# Patient Record
Sex: Female | Born: 1935 | Race: White | Hispanic: No | Marital: Married | Smoking: Never smoker
Health system: Southern US, Community
[De-identification: ages and names within clinical notes are randomized; demographics above are authoritative.]

## PROBLEM LIST (undated history)

## (undated) DIAGNOSIS — R32 Unspecified urinary incontinence: Secondary | ICD-10-CM

## (undated) DIAGNOSIS — I2510 Atherosclerotic heart disease of native coronary artery without angina pectoris: Secondary | ICD-10-CM

## (undated) DIAGNOSIS — I10 Essential (primary) hypertension: Secondary | ICD-10-CM

## (undated) DIAGNOSIS — I219 Acute myocardial infarction, unspecified: Secondary | ICD-10-CM

## (undated) DIAGNOSIS — Z4420 Encounter for fitting and adjustment of artificial eye, unspecified: Secondary | ICD-10-CM

## (undated) DIAGNOSIS — I255 Ischemic cardiomyopathy: Secondary | ICD-10-CM

## (undated) DIAGNOSIS — H409 Unspecified glaucoma: Secondary | ICD-10-CM

## (undated) DIAGNOSIS — I513 Intracardiac thrombosis, not elsewhere classified: Secondary | ICD-10-CM

## (undated) DIAGNOSIS — N390 Urinary tract infection, site not specified: Secondary | ICD-10-CM

## (undated) DIAGNOSIS — C50919 Malignant neoplasm of unspecified site of unspecified female breast: Secondary | ICD-10-CM

## (undated) HISTORY — PX: MASTECTOMY: SHX3

## (undated) HISTORY — PX: CORONARY ARTERY BYPASS GRAFT: SHX141

## (undated) HISTORY — PX: JOINT REPLACEMENT: SHX530

---

## 1998-11-11 ENCOUNTER — Ambulatory Visit: Admission: RE | Admit: 1998-11-11 | Discharge: 1998-11-11 | Payer: Self-pay | Admitting: Orthopedic Surgery

## 1998-11-11 ENCOUNTER — Encounter: Payer: Self-pay | Admitting: Orthopedic Surgery

## 1998-11-27 ENCOUNTER — Encounter: Payer: Self-pay | Admitting: Orthopedic Surgery

## 1998-11-27 ENCOUNTER — Inpatient Hospital Stay (HOSPITAL_COMMUNITY): Admission: RE | Admit: 1998-11-27 | Discharge: 1998-12-03 | Payer: Self-pay | Admitting: Orthopedic Surgery

## 2006-04-22 ENCOUNTER — Ambulatory Visit: Payer: Self-pay

## 2006-05-12 ENCOUNTER — Inpatient Hospital Stay (HOSPITAL_COMMUNITY): Admission: RE | Admit: 2006-05-12 | Discharge: 2006-05-15 | Payer: Self-pay | Admitting: Orthopedic Surgery

## 2013-05-25 ENCOUNTER — Emergency Department (HOSPITAL_COMMUNITY)
Admission: EM | Admit: 2013-05-25 | Discharge: 2013-05-25 | Disposition: A | Discharge status: Home / Self Care | Payer: Medicare Other | Attending: Emergency Medicine | Admitting: Emergency Medicine

## 2013-05-25 ENCOUNTER — Encounter (HOSPITAL_COMMUNITY): Payer: Self-pay | Admitting: Emergency Medicine

## 2013-05-25 DIAGNOSIS — I2510 Atherosclerotic heart disease of native coronary artery without angina pectoris: Secondary | ICD-10-CM | POA: Insufficient documentation

## 2013-05-25 DIAGNOSIS — Z79899 Other long term (current) drug therapy: Secondary | ICD-10-CM | POA: Insufficient documentation

## 2013-05-25 DIAGNOSIS — Z7982 Long term (current) use of aspirin: Secondary | ICD-10-CM | POA: Insufficient documentation

## 2013-05-25 DIAGNOSIS — R040 Epistaxis: Secondary | ICD-10-CM | POA: Insufficient documentation

## 2013-05-25 DIAGNOSIS — I10 Essential (primary) hypertension: Secondary | ICD-10-CM | POA: Insufficient documentation

## 2013-05-25 DIAGNOSIS — K625 Hemorrhage of anus and rectum: Secondary | ICD-10-CM

## 2013-05-25 DIAGNOSIS — Z951 Presence of aortocoronary bypass graft: Secondary | ICD-10-CM | POA: Insufficient documentation

## 2013-05-25 HISTORY — DX: Essential (primary) hypertension: I10

## 2013-05-25 HISTORY — DX: Atherosclerotic heart disease of native coronary artery without angina pectoris: I25.10

## 2013-05-25 LAB — CBC WITH DIFFERENTIAL/PLATELET
Basophils Absolute: 0.1 10*3/uL (ref 0.0–0.1)
Basophils Absolute: 0 10*3/uL (ref 0.0–0.1)
Basophils Relative: 1 % (ref 0–1)
Eosinophils Absolute: 0.1 10*3/uL (ref 0.0–0.7)
Eosinophils Absolute: 0.1 10*3/uL (ref 0.0–0.7)
Eosinophils Relative: 1 % (ref 0–5)
Eosinophils Relative: 1 % (ref 0–5)
HCT: 35.8 % — ABNORMAL LOW (ref 36.0–46.0)
Hemoglobin: 11.5 g/dL — ABNORMAL LOW (ref 12.0–15.0)
Hemoglobin: 10.5 g/dL — ABNORMAL LOW (ref 12.0–15.0)
Lymphocytes Relative: 21 % (ref 12–46)
Lymphs Abs: 1.7 10*3/uL (ref 0.7–4.0)
Lymphs Abs: 1.9 10*3/uL (ref 0.7–4.0)
MCH: 27.2 pg (ref 26.0–34.0)
MCH: 26.6 pg (ref 26.0–34.0)
MCHC: 32.1 g/dL (ref 30.0–36.0)
MCV: 84.6 fL (ref 78.0–100.0)
MCV: 83.2 fL (ref 78.0–100.0)
Monocytes Absolute: 0.6 10*3/uL (ref 0.1–1.0)
Monocytes Absolute: 0.6 10*3/uL (ref 0.1–1.0)
Monocytes Relative: 7 % (ref 3–12)
Monocytes Relative: 7 % (ref 3–12)
Neutro Abs: 5.9 10*3/uL (ref 1.7–7.7)
Neutro Abs: 5.1 10*3/uL (ref 1.7–7.7)
Neutrophils Relative %: 71 % (ref 43–77)
Platelets: 301 10*3/uL (ref 150–400)
RBC: 4.23 MIL/uL (ref 3.87–5.11)
RBC: 3.94 MIL/uL (ref 3.87–5.11)
RDW: 15 % (ref 11.5–15.5)
WBC: 8.4 10*3/uL (ref 4.0–10.5)

## 2013-05-25 LAB — BASIC METABOLIC PANEL
CO2: 25 mEq/L (ref 19–32)
Calcium: 8.9 mg/dL (ref 8.4–10.5)
Chloride: 102 mEq/L (ref 96–112)
Glucose, Bld: 105 mg/dL — ABNORMAL HIGH (ref 70–99)
Potassium: 4.3 mEq/L (ref 3.5–5.1)
Sodium: 134 mEq/L — ABNORMAL LOW (ref 135–145)

## 2013-05-25 MED ORDER — SODIUM CHLORIDE 0.9 % IV BOLUS (SEPSIS)
500.0000 mL | Freq: Once | INTRAVENOUS | Status: AC
  Administered 2013-05-25: 500 mL via INTRAVENOUS

## 2013-05-25 NOTE — ED Notes (Signed)
Bed: WA09 Expected date:  Expected time:  Means of arrival:  Comments: EMS 79yo nosebleed / rectal bleed

## 2013-05-25 NOTE — ED Provider Notes (Signed)
CSN: [id number]     Arrival date & time 05/25/13  2013 History   First MD Initiated Contact with Patient 05/25/13 2111     Chief Complaint  Patient presents with  . Epistaxis  . Rectal Bleeding   (Consider location/radiation/quality/duration/timing/severity/associated sxs/prior Treatment) Patient is a 79 y.o. female presenting with hematochezia. The history is provided by the patient (the pt states she had some rectal bleeding today and some nasal bleeding).  Rectal Bleeding Quality:  Maroon Amount:  Moderate Timing:  Rare Progression:  Resolved Chronicity:  New Context: not anal fissures   Associated symptoms: no abdominal pain     Past Medical History  Diagnosis Date  . Coronary artery disease   . Hypertension    Past Surgical History  Procedure Laterality Date  . Mastectomy    . Coronary artery bypass graft     No family history on file. History  Substance Use Topics  . Smoking status: Never Smoker   . Smokeless tobacco: Not on file  . Alcohol Use: No   OB History   Grav Para Term Preterm Abortions TAB SAB Ect Mult Living                 Review of Systems  Constitutional: Negative for appetite change and fatigue.  HENT: Negative for congestion, ear discharge and sinus pressure.   Eyes: Negative for discharge.  Respiratory: Negative for cough.   Cardiovascular: Negative for chest pain.  Gastrointestinal: Positive for blood in stool and hematochezia. Negative for abdominal pain and diarrhea.  Genitourinary: Negative for frequency and hematuria.  Musculoskeletal: Negative for back pain.  Skin: Negative for rash.  Neurological: Negative for seizures and headaches.  Psychiatric/Behavioral: Negative for hallucinations.    Allergies  Codeine  Home Medications   Current Outpatient Rx  Name  Route  Sig  Dispense  Refill  . aspirin 325 MG EC tablet   Oral   Take 325 mg by mouth at bedtime.         . benazepril (LOTENSIN) 5 MG tablet   Oral   Take 5 mg  by mouth at bedtime.         . cholecalciferol (VITAMIN D) 1000 UNITS tablet   Oral   Take 2,000 Units by mouth at bedtime.         . Cranberry 450 MG TABS   Oral   Take 900 mg by mouth at bedtime.         Marland Kitchen latanoprost (XALATAN) 0.005 % ophthalmic solution   Left Eye   Place 1 drop into the left eye at bedtime.         . Magnesium 100 MG TABS   Oral   Take 600 mg by mouth at bedtime.         . metoprolol tartrate (LOPRESSOR) 25 MG tablet   Oral   Take 25 mg by mouth at bedtime.          . Multiple Vitamin (MULTIVITAMIN WITH MINERALS) TABS tablet   Oral   Take 1 tablet by mouth at bedtime.         . vitamin C (ASCORBIC ACID) 500 MG tablet   Oral   Take 1,000 mg by mouth at bedtime.          BP 143/75  Pulse 95  Temp(Src) 98.4 F (36.9 C) (Oral)  Resp 18  SpO2 98% Physical Exam  Constitutional: She is oriented to person, place, and time. She appears well-developed.  HENT:  Head: Normocephalic.  Eyes: Conjunctivae and EOM are normal. No scleral icterus.  Neck: Neck supple. No thyromegaly present.  Cardiovascular: Normal rate and regular rhythm.  Exam reveals no gallop and no friction rub.   No murmur heard. Pulmonary/Chest: No stridor. She has no wheezes. She has no rales. She exhibits no tenderness.  Abdominal: She exhibits no distension. There is no tenderness. There is no rebound.  Genitourinary:  Brown stool.  Hem neg  Musculoskeletal: Normal range of motion. She exhibits no edema.  Lymphadenopathy:    She has no cervical adenopathy.  Neurological: She is oriented to person, place, and time. She exhibits normal muscle tone. Coordination normal.  Skin: No rash noted. No erythema.  Psychiatric: She has a normal mood and affect. Her behavior is normal.    ED Course  Procedures (including critical care time) Labs Review Labs Reviewed  CBC WITH DIFFERENTIAL - Abnormal; Notable for the following:    Hemoglobin 11.5 (*)    HCT 35.8 (*)    All  other components within normal limits  BASIC METABOLIC PANEL - Abnormal; Notable for the following:    Sodium 134 (*)    Glucose, Bld 105 (*)    GFR calc non Af Amer 76 (*)    GFR calc Af Amer 88 (*)    All other components within normal limits  CBC WITH DIFFERENTIAL - Abnormal; Notable for the following:    Hemoglobin 10.5 (*)    HCT 32.8 (*)    All other components within normal limits  BASIC METABOLIC PANEL   Imaging Review No results found.  EKG Interpretation   None       MDM   1. Rectal bleeding        Benny Lennert, MD 05/25/13 [phone]

## 2013-05-25 NOTE — ED Notes (Addendum)
Lab notified that blood samples for CMP were incompatable with life and need to redraw specimens

## 2013-05-25 NOTE — ED Notes (Signed)
Arrives from home vie [facility], reports doing laundry when she experienced bright red blood per rectum and epistaxis, both of which lasted approx 3 min and resolved, denies pain, SOB, and pain, N/V/D, is asymptomatic and VSS, ambulatory on arrival and in NAD

## 2013-07-06 ENCOUNTER — Inpatient Hospital Stay (HOSPITAL_COMMUNITY)
Admission: EM | Admit: 2013-07-06 | Discharge: 2013-07-09 | DRG: 247 | Disposition: A | Discharge status: Home / Self Care | Payer: Medicare Other | Attending: Internal Medicine | Admitting: Internal Medicine

## 2013-07-06 ENCOUNTER — Encounter (HOSPITAL_COMMUNITY): Payer: Self-pay | Admitting: Emergency Medicine

## 2013-07-06 ENCOUNTER — Emergency Department (HOSPITAL_COMMUNITY): Payer: Medicare Other

## 2013-07-06 DIAGNOSIS — Z96649 Presence of unspecified artificial hip joint: Secondary | ICD-10-CM

## 2013-07-06 DIAGNOSIS — E785 Hyperlipidemia, unspecified: Secondary | ICD-10-CM | POA: Diagnosis present

## 2013-07-06 DIAGNOSIS — I2510 Atherosclerotic heart disease of native coronary artery without angina pectoris: Secondary | ICD-10-CM | POA: Diagnosis present

## 2013-07-06 DIAGNOSIS — Z833 Family history of diabetes mellitus: Secondary | ICD-10-CM

## 2013-07-06 DIAGNOSIS — I5189 Other ill-defined heart diseases: Secondary | ICD-10-CM | POA: Diagnosis present

## 2013-07-06 DIAGNOSIS — Z970 Presence of artificial eye: Secondary | ICD-10-CM

## 2013-07-06 DIAGNOSIS — Z889 Allergy status to unspecified drugs, medicaments and biological substances status: Secondary | ICD-10-CM

## 2013-07-06 DIAGNOSIS — Z79899 Other long term (current) drug therapy: Secondary | ICD-10-CM

## 2013-07-06 DIAGNOSIS — I25810 Atherosclerosis of coronary artery bypass graft(s) without angina pectoris: Secondary | ICD-10-CM | POA: Diagnosis present

## 2013-07-06 DIAGNOSIS — I2582 Chronic total occlusion of coronary artery: Secondary | ICD-10-CM | POA: Diagnosis present

## 2013-07-06 DIAGNOSIS — H409 Unspecified glaucoma: Secondary | ICD-10-CM | POA: Diagnosis present

## 2013-07-06 DIAGNOSIS — I219 Acute myocardial infarction, unspecified: Secondary | ICD-10-CM | POA: Diagnosis present

## 2013-07-06 DIAGNOSIS — Z823 Family history of stroke: Secondary | ICD-10-CM

## 2013-07-06 DIAGNOSIS — I214 Non-ST elevation (NSTEMI) myocardial infarction: Principal | ICD-10-CM | POA: Diagnosis present

## 2013-07-06 DIAGNOSIS — Z853 Personal history of malignant neoplasm of breast: Secondary | ICD-10-CM

## 2013-07-06 DIAGNOSIS — Z7982 Long term (current) use of aspirin: Secondary | ICD-10-CM

## 2013-07-06 DIAGNOSIS — R079 Chest pain, unspecified: Secondary | ICD-10-CM

## 2013-07-06 DIAGNOSIS — I10 Essential (primary) hypertension: Secondary | ICD-10-CM | POA: Diagnosis present

## 2013-07-06 DIAGNOSIS — Z8249 Family history of ischemic heart disease and other diseases of the circulatory system: Secondary | ICD-10-CM

## 2013-07-06 DIAGNOSIS — Z951 Presence of aortocoronary bypass graft: Secondary | ICD-10-CM

## 2013-07-06 DIAGNOSIS — I2589 Other forms of chronic ischemic heart disease: Secondary | ICD-10-CM | POA: Diagnosis present

## 2013-07-06 DIAGNOSIS — I252 Old myocardial infarction: Secondary | ICD-10-CM

## 2013-07-06 DIAGNOSIS — I200 Unstable angina: Secondary | ICD-10-CM | POA: Diagnosis present

## 2013-07-06 HISTORY — DX: Acute myocardial infarction, unspecified: I21.9

## 2013-07-06 HISTORY — DX: Ischemic cardiomyopathy: I25.5

## 2013-07-06 HISTORY — DX: Unspecified urinary incontinence: R32

## 2013-07-06 HISTORY — DX: Unspecified glaucoma: H40.9

## 2013-07-06 HISTORY — DX: Encounter for fitting and adjustment of artificial eye, unspecified: Z44.20

## 2013-07-06 HISTORY — DX: Intracardiac thrombosis, not elsewhere classified: I51.3

## 2013-07-06 HISTORY — DX: Malignant neoplasm of unspecified site of unspecified female breast: C50.919

## 2013-07-06 HISTORY — DX: Urinary tract infection, site not specified: N39.0

## 2013-07-06 LAB — BASIC METABOLIC PANEL
CO2: 28 mEq/L (ref 19–32)
Calcium: 9 mg/dL (ref 8.4–10.5)
GFR calc Af Amer: 87 mL/min — ABNORMAL LOW (ref >90)
GFR calc non Af Amer: 75 mL/min — ABNORMAL LOW (ref >90)
Glucose, Bld: 107 mg/dL — ABNORMAL HIGH (ref 70–99)
Sodium: 134 mEq/L — ABNORMAL LOW (ref 135–145)

## 2013-07-06 LAB — CBC WITH DIFFERENTIAL/PLATELET
Basophils Absolute: 0 10*3/uL (ref 0.0–0.1)
Basophils Relative: 0 % (ref 0–1)
Eosinophils Absolute: 0.1 10*3/uL (ref 0.0–0.7)
Eosinophils Relative: 1 % (ref 0–5)
Lymphs Abs: 1.6 10*3/uL (ref 0.7–4.0)
MCH: 26.2 pg (ref 26.0–34.0)
MCV: 82.4 fL (ref 78.0–100.0)
Neutro Abs: 8.7 10*3/uL — ABNORMAL HIGH (ref 1.7–7.7)
Neutrophils Relative %: 77 % (ref 43–77)
Platelets: 361 10*3/uL (ref 150–400)
RDW: 15.7 % — ABNORMAL HIGH (ref 11.5–15.5)

## 2013-07-06 MED ORDER — HEPARIN BOLUS VIA INFUSION
2500.0000 [IU] | Freq: Once | INTRAVENOUS | Status: AC
  Administered 2013-07-06: 2500 [IU] via INTRAVENOUS
  Filled 2013-07-06 (×2): qty 2500

## 2013-07-06 MED ORDER — DIPHENHYDRAMINE HCL 25 MG PO CAPS
25.0000 mg | ORAL_CAPSULE | Freq: Once | ORAL | Status: AC
  Administered 2013-07-06: 25 mg via ORAL
  Filled 2013-07-06: qty 1

## 2013-07-06 MED ORDER — SODIUM CHLORIDE 0.9 % IV SOLN: 250.0000 mL | INTRAVENOUS | Status: DC | PRN

## 2013-07-06 MED ORDER — ACETAMINOPHEN 325 MG PO TABS
650.0000 mg | ORAL_TABLET | ORAL | Status: DC | PRN
  Administered 2013-07-07: 650 mg via ORAL
  Filled 2013-07-06: qty 2

## 2013-07-06 MED ORDER — ASPIRIN 300 MG RE SUPP
300.0000 mg | RECTAL | Status: DC
  Filled 2013-07-06: qty 1

## 2013-07-06 MED ORDER — NITROGLYCERIN IN D5W 200-5 MCG/ML-% IV SOLN: 2.0000 ug/min | INTRAVENOUS | Status: DC

## 2013-07-06 MED ORDER — SODIUM CHLORIDE 0.9 % IJ SOLN: 3.0000 mL | INTRAMUSCULAR | Status: DC | PRN

## 2013-07-06 MED ORDER — NITROGLYCERIN 0.4 MG SL SUBL
0.4000 mg | SUBLINGUAL_TABLET | SUBLINGUAL | Status: DC | PRN
  Administered 2013-07-07 (×2): 0.4 mg via SUBLINGUAL
  Filled 2013-07-06: qty 25

## 2013-07-06 MED ORDER — METOPROLOL TARTRATE 12.5 MG HALF TABLET: 6.2500 mg | ORAL_TABLET | Freq: Four times a day (QID) | ORAL | Status: DC

## 2013-07-06 MED ORDER — ASPIRIN 81 MG PO CHEW: 324.0000 mg | CHEWABLE_TABLET | ORAL | Status: DC

## 2013-07-06 MED ORDER — ATORVASTATIN CALCIUM 80 MG PO TABS
80.0000 mg | ORAL_TABLET | Freq: Every day | ORAL | Status: DC
  Administered 2013-07-08: 80 mg via ORAL
  Filled 2013-07-06 (×3): qty 1

## 2013-07-06 MED ORDER — ASPIRIN 81 MG PO CHEW: 81.0000 mg | CHEWABLE_TABLET | ORAL | Status: DC

## 2013-07-06 MED ORDER — NITROGLYCERIN 0.4 MG SL SUBL: 0.4000 mg | SUBLINGUAL_TABLET | SUBLINGUAL | Status: DC | PRN

## 2013-07-06 MED ORDER — ASPIRIN EC 81 MG PO TBEC
81.0000 mg | DELAYED_RELEASE_TABLET | Freq: Every day | ORAL | Status: DC
  Administered 2013-07-07 – 2013-07-09 (×3): 81 mg via ORAL
  Filled 2013-07-06 (×3): qty 1

## 2013-07-06 MED ORDER — HEPARIN (PORCINE) IN NACL 100-0.45 UNIT/ML-% IJ SOLN
1000.0000 [IU]/h | INTRAMUSCULAR | Status: DC
  Administered 2013-07-06: 800 [IU]/h via INTRAVENOUS
  Administered 2013-07-07: 1000 [IU]/h via INTRAVENOUS
  Filled 2013-07-06 (×2): qty 250

## 2013-07-06 MED ORDER — METOPROLOL TARTRATE 25 MG/10 ML ORAL SUSPENSION
6.2500 mg | Freq: Three times a day (TID) | ORAL | Status: DC
  Administered 2013-07-06 – 2013-07-09 (×8): 6.25 mg via ORAL
  Filled 2013-07-06 (×15): qty 2.5

## 2013-07-06 MED ORDER — SODIUM CHLORIDE 0.9 % IV SOLN
1.0000 mL/kg/h | INTRAVENOUS | Status: DC
  Administered 2013-07-07: 1 mL/kg/h via INTRAVENOUS

## 2013-07-06 MED ORDER — SODIUM CHLORIDE 0.9 % IJ SOLN: 3.0000 mL | Freq: Two times a day (BID) | INTRAMUSCULAR | Status: DC

## 2013-07-06 MED ORDER — ONDANSETRON HCL 4 MG/2ML IJ SOLN: 4.0000 mg | Freq: Four times a day (QID) | INTRAMUSCULAR | Status: DC | PRN

## 2013-07-06 MED ORDER — METOPROLOL TARTRATE 12.5 MG HALF TABLET
12.5000 mg | ORAL_TABLET | Freq: Four times a day (QID) | ORAL | Status: DC
  Filled 2013-07-06: qty 1

## 2013-07-06 NOTE — ED Notes (Signed)
critical  Troponin 1.44

## 2013-07-06 NOTE — H&P (Signed)
Primary Physician:  Paulino Rily Primary Cardiologist:  New  Seen by Billie Ruddy in 90s   HPI: Patient is an 79 yo who presents to the ER  For evaluation of CP   Patient has a history of CAD  MI in 1993  Underwent CABG at [facility] she thinks  Saw Torelli for a little bit then none.   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 Felt like she had done an exercise not used to  May away  Addressed a few more Mon  Had a little discomfort  Not exacerbated by breathing or movement  Got better by end of day. Tues a littlle better  Addressed more cards Tues evening Wed felt not too bad  Still a little sore. Last night didn't sleep well  Not sure why.   Thurdsay chest discomfort wouldn't go away  Did get a little worse  No SOB  No diaphoresis.  Went to primary MD  EKG with T wave inversion in I, AVL and V2.  New Sent to ER   Currently pain free.   Activity limited by knee and hips.     T wave inversion in I, avl and V2 that was new.  Sent to ER  .    PAST MEDICAL HISTORY:  1. Glaucoma.  2. History of bronchitis.  3. History of MI in 1993.  4. History of UTIs.  5. History of urinary incontinence.  6. Breast cancer.  7. Coronary arterial disease.   PAST SURGICAL HISTORY:  1. Right mastectomy.  2. She has had a diagnostic cardiac catheterization, resulting in CABG      of two vessels.  3. Tonsillectomy.  4. Partial hysterectomy.  5. Hip replacement x2.  6. Cataract surgery.  7. She has had her right eye removed in 2005.  8. Bilateral arthroscopies.   FAMILY HISTORY:  Mother with a history of stroke and diabetes.  Father  with history of heart disease.   SOCIAL HISTORY:  Married.  Retired.  Nonsmoker.  No alcohol.  Three  children.   REVIEW OF SYSTEMS:  GENERAL:  No fever, chills, or night sweats.  NEURO:  No seizures, syncope, paralysis.   RESPIRATORY:  No shortness of breath,  productive cough or hemoptysis.  CARDIOVASCULAR:  No chest pain.  No  orthopnea     Past Medical History  Diagnosis Date  . Coronary artery disease   . Hypertension   . Artificial eye fitting/adjustment      (Not in a hospital admission)     Infusions:   Allergies  Allergen Reactions  . Codeine Other (See Comments)    "Nightmares"  . Sulfa Antibiotics Other (See Comments)    GI     History   Social History  . Marital Status: Married    Spouse Name: N/A    Number of Children: N/A  . Years of Education: N/A   Occupational History  . Not on file.   Social History Main Topics  . Smoking status: Never Smoker   . Smokeless tobacco: Not on file  . Alcohol Use: No  . Drug Use: Not on file  . Sexual Activity: Not on file   Other Topics Concern  . Not on file   Social History Narrative  . No narrative on file  History reviewed. No pertinent family history.  REVIEW OF SYSTEMS:  All systems reviewed  Negative to the above problem except as noted above.    PHYSICAL EXAM: Filed Vitals:   07/06/13 1250  BP:   Pulse: 88  Resp: 19   BP 140/70 No intake or output data in the 24 hours ending 07/06/13 1505  General:  Well appearing. No respiratory difficulty HEENT: normal Neck: supple. no JVD. Carotids 2+ bilat; no bruits. No lymphadenopathy or thryomegaly appreciated. Cor: PMI nondisplaced. Regular rate & rhythm. No rubs, gallops or murmurs. Lungs: clear Abdomen: soft, nontender, nondistended. No hepatosplenomegaly. No bruits or masses. Good bowel sounds. Extremities: no cyanosis, clubbing, rash, edema Neuro: alert & oriented x 3, cranial nerves grossly intact. moves all 4 extremities w/o difficulty. Affect pleasant.  ECG:  SR  T wave inversion   Results for orders placed during the hospital encounter of 07/06/13 (from the past 24 hour(s))  CBC WITH DIFFERENTIAL     Status: Abnormal   Collection Time    07/06/13   1:37 PM      Result Value Range   WBC 11.3 (*) 4.0 - 10.5 K/uL   RBC 4.43  3.87 - 5.11 MIL/uL   Hemoglobin 11.6 (*) 12.0 - 15.0 g/dL   HCT 19.1  47.8 - 29.5 %   MCV 82.4  78.0 - 100.0 fL   MCH 26.2  26.0 - 34.0 pg   MCHC 31.8  30.0 - 36.0 g/dL   RDW 62.1 (*) 30.8 - 65.7 %   Platelets 361  150 - 400 K/uL   Neutrophils Relative % 77  43 - 77 %   Neutro Abs 8.7 (*) 1.7 - 7.7 K/uL   Lymphocytes Relative 15  12 - 46 %   Lymphs Abs 1.6  0.7 - 4.0 K/uL   Monocytes Relative 8  3 - 12 %   Monocytes Absolute 0.9  0.1 - 1.0 K/uL   Eosinophils Relative 1  0 - 5 %   Eosinophils Absolute 0.1  0.0 - 0.7 K/uL   Basophils Relative 0  0 - 1 %   Basophils Absolute 0.0  0.0 - 0.1 K/uL  BASIC METABOLIC PANEL     Status: Abnormal   Collection Time    07/06/13  1:37 PM      Result Value Range   Sodium 134 (*) 135 - 145 mEq/L   Potassium 4.3  3.5 - 5.1 mEq/L   Chloride 98  96 - 112 mEq/L   CO2 28  19 - 32 mEq/L   Glucose, Bld 107 (*) 70 - 99 mg/dL   BUN 15  6 - 23 mg/dL   Creatinine, Ser 8.46  0.50 - 1.10 mg/dL   Calcium 9.0  8.4 - 96.2 mg/dL   GFR calc non Af Amer 75 (*) >90 mL/min   GFR calc Af Amer 87 (*) >90 mL/min  TROPONIN I     Status: Abnormal   Collection Time    07/06/13  1:37 PM      Result Value Range   Troponin I 1.44 (*) <0.30 ng/mL   Dg Chest Port 1 View  07/06/2013   CLINICAL DATA:  Chest pain.  EXAM: PORTABLE CHEST - 1 VIEW  COMPARISON:  None.  FINDINGS: Portable view of the chest demonstrates elevation of the right hemidiaphragm. Postsurgical changes in the sternum and right axilla. Lungs are clear without airspace disease or edema. Heart size is within normal limits. Negative for a pneumothorax.  IMPRESSION:  Postoperative changes in the chest. No acute cardiopulmonary disease.   Electronically Signed   By: Richarda Overlie M.D.   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 Now with 1 wk of intermitt chest discomfort.  Currently symptom free. EKG with changes susp for ischemia  Trop is increased. Discussed with patient  Would recomm L heart catheterization to define anatomy  Without doing, patient's will continue to have problems esp if does any acitvity. Patient agrees.   CABG done at [facility]  Fax for Medical records is [phone].  Hope to get Op note by tomorrow  Patient does not remember surgeon  For now will admit to step down.  IV heparin  Lopressor  ASA  2.  HL  Start lipitor 80  Lipids in AM  3.  HTN  Follow during admit

## 2013-07-06 NOTE — ED Notes (Signed)
Per [facility] pt from PCP for evaluation of current chest pain, pt c/o upper chest wall pain radiating to bilateral upper arm pain. PCP sent her to ED for further evaluation of changed in her EKG from 2 months ago that was sent with pt. Pt is non compliant with cholesterol medications. VSS BP 102/40, HR 96, RR 16, SpO2 96% RA, denies any pain with EMS, PCP gave pt ASA 324 mg prior to EMS arrival. 20 Gauge LAC SL

## 2013-07-06 NOTE — ED Notes (Signed)
Heart healthy tray ordered 

## 2013-07-06 NOTE — ED Notes (Signed)
Attempted to call report to floor. RN stated she would call back.

## 2013-07-06 NOTE — ED Notes (Signed)
Pharmacy called to inquire about heparin gtt & to send it

## 2013-07-06 NOTE — ED Provider Notes (Signed)
CSN: [id number]     Arrival date & time 07/06/13  1229 History   First MD Initiated Contact with Patient 07/06/13 1310     Chief Complaint  Patient presents with  . Chest Pain   (Consider location/radiation/quality/duration/timing/severity/associated sxs/prior Treatment) Patient is a 79 y.o. female presenting with chest pain.  Chest Pain Pain location:  Substernal area Pain quality comment:  "discomfort" Radiates to: bilateral shoulder. Pain severity now: severe at worst, none in ED. Onset quality:  Sudden Duration:  2 hours Timing:  Constant Progression:  Resolved Chronicity:  Recurrent (similar episode 5 days ago) Context comment:  Rest Relieved by:  Nothing Ineffective treatments:  None tried Associated symptoms: no abdominal pain, no cough, no diaphoresis, no dizziness, no fever, no lower extremity edema, no near-syncope, no shortness of breath and not vomiting     Past Medical History  Diagnosis Date  . Coronary artery disease   . Hypertension   . Artificial eye fitting/adjustment    Past Surgical History  Procedure Laterality Date  . Mastectomy    . Coronary artery bypass graft    . Joint replacement      Hip replacements   History reviewed. No pertinent family history. History  Substance Use Topics  . Smoking status: Never Smoker   . Smokeless tobacco: Not on file  . Alcohol Use: No   OB History   Grav Para Term Preterm Abortions TAB SAB Ect Mult Living                 Review of Systems  Constitutional: Negative for fever and diaphoresis.  Respiratory: Negative for cough and shortness of breath.   Cardiovascular: Positive for chest pain. Negative for near-syncope.  Gastrointestinal: Negative for vomiting and abdominal pain.  Neurological: Negative for dizziness.  All other systems reviewed and are negative.    Allergies  Codeine and Sulfa antibiotics  Home Medications   Current Outpatient Rx  Name  Route  Sig  Dispense  Refill  . aspirin EC  81 MG tablet   Oral   Take 81 mg by mouth daily.         . benazepril (LOTENSIN) 5 MG tablet   Oral   Take 5 mg by mouth at bedtime.         . cholecalciferol (VITAMIN D) 1000 UNITS tablet   Oral   Take 2,000 Units by mouth at bedtime.         . Coenzyme Q10 (COQ-10) 200 MG CAPS   Oral   Take 1 capsule by mouth daily.         . Cranberry 450 MG TABS   Oral   Take 900 mg by mouth at bedtime.         Marland Kitchen latanoprost (XALATAN) 0.005 % ophthalmic solution   Left Eye   Place 1 drop into the left eye at bedtime.         . Magnesium 100 MG TABS   Oral   Take 600 mg by mouth at bedtime.         . metoprolol tartrate (LOPRESSOR) 25 MG tablet   Oral   Take 12.5 mg by mouth at bedtime.          Marland Kitchen neomycin-polymyxin b-dexamethasone (MAXITROL) 3.5-10000-0.1 OINT   Right Eye   Place 1 application into the right eye at bedtime.         Gracelyn Nurse Leaf Extract 500 MG CAPS   Oral   Take 1 capsule by  mouth daily.         . vitamin C (ASCORBIC ACID) 500 MG tablet   Oral   Take 1,000 mg by mouth at bedtime.          BP 101/54  Pulse 88  Resp 19  SpO2 97% Physical Exam  Nursing note and vitals reviewed. Constitutional: She is oriented to person, place, and time. She appears well-developed and well-nourished. No distress.  HENT:  Head: Normocephalic and atraumatic.  Mouth/Throat: Oropharynx is clear and moist.  Eyes: Conjunctivae are normal. Pupils are equal, round, and reactive to light. No scleral icterus.  Neck: Neck supple.  Cardiovascular: Normal rate, regular rhythm, normal heart sounds and intact distal pulses.   No murmur heard. Pulmonary/Chest: Effort normal and breath sounds normal. No stridor. No respiratory distress. She has no rales.  Abdominal: Soft. Bowel sounds are normal. She exhibits no distension. There is no tenderness.  Musculoskeletal: Normal range of motion.  Neurological: She is alert and oriented to person, place, and time.  Skin:  Skin is warm and dry. No rash noted.  Psychiatric: She has a normal mood and affect. Her behavior is normal.    ED Course  Procedures (including critical care time) Labs Review Labs Reviewed  CBC WITH DIFFERENTIAL - Abnormal; Notable for the following:    WBC 11.3 (*)    Hemoglobin 11.6 (*)    RDW 15.7 (*)    Neutro Abs 8.7 (*)    All other components within normal limits  BASIC METABOLIC PANEL - Abnormal; Notable for the following:    Sodium 134 (*)    Glucose, Bld 107 (*)    GFR calc non Af Amer 75 (*)    GFR calc Af Amer 87 (*)    All other components within normal limits  TROPONIN I - Abnormal; Notable for the following:    Troponin I 1.44 (*)    All other components within normal limits  HEPARIN LEVEL (UNFRACTIONATED)   Imaging Review Dg Chest Port 1 View  07/06/2013   CLINICAL DATA:  Chest pain.  EXAM: PORTABLE CHEST - 1 VIEW  COMPARISON:  None.  FINDINGS: Portable view of the chest demonstrates elevation of the right hemidiaphragm. Postsurgical changes in the sternum and right axilla. Lungs are clear without airspace disease or edema. Heart size is within normal limits. Negative for a pneumothorax.  IMPRESSION: Postoperative changes in the chest. No acute cardiopulmonary disease.   Electronically Signed   By: Richarda Overlie M.D.   On: 07/06/2013 13:48  All radiology studies independently viewed by me.     EKG Interpretation    Date/Time:  Thursday July 06 2013 12:50:42 EST Ventricular Rate:  91 PR Interval:  150 QRS Duration: 83 QT Interval:  394 QTC Calculation: 485 R Axis:   46 Text Interpretation:  Age not entered, assumed to be  79 years old for purpose of ECG interpretation Sinus rhythm Atrial premature complex Abnrm T, consider ischemia, anterolateral lds Compared to outside EKGs, t wave abnormalities are new Confirmed by [facility]  MD, TREY ([id number]) on 07/06/2013 1:10:48 PM            MDM   1. NSTEMI (non-ST elevated myocardial infarction)     79 year old female with a history of CABG presenting with chest pain. Pain resolved while she was at her doctor's office this morning. EKG shows new T wave changes. Given Aspirin by her PCP just prior to arrival.  Labs concerning for NSTEMI.  Placed on Heparin.  Discussed with Dr. Tenny Craw (Cardiology) who will admit for further management.      Candyce Churn, MD 07/06/13 [phone]

## 2013-07-06 NOTE — Progress Notes (Signed)
ANTICOAGULATION CONSULT NOTE - Initial Consult  Pharmacy Consult:  Heparin Indication:  ACS  Allergies  Allergen Reactions  . Codeine Other (See Comments)    "Nightmares"  . Sulfa Antibiotics Other (See Comments)    GI     Patient Measurements: Height: 5\' 2"  (157.5 cm) Weight: 153 lb (69.4 kg) IBW/kg (Calculated) : 50.1 Heparin Dosing Weight:  69 kg  Vital Signs: BP: 101/54 mmHg (12/18 1249) Pulse Rate: 88 (12/18 1250)  Labs:  Recent Labs  07/06/13 1337  HGB 11.6*  HCT 36.5  PLT 361  CREATININE 0.72  TROPONINI 1.44*    Estimated Creatinine Clearance: 45.2 ml/min (by C-G formula based on Cr of 0.72).   Medical History: Past Medical History  Diagnosis Date  . Coronary artery disease   . Hypertension   . Artificial eye fitting/adjustment        Assessment: 41 YOF admitted with CP to start IV heparin.  Patient reported a recent GIB while taking ASA 325mg  daily and her dose was reduced.  Based on our record, patient was seen in November for epistaxis and rectal bleeding.  Baseline labs reviewed.   Goal of Therapy:  Heparin level 0.3-0.7 units/ml Monitor platelets by anticoagulation protocol: Yes    Plan:  - Heparin 2500 units IV bolus x 1, then - Heparin gtt at 800 units/hr - Check 8 hr HL - Daily HL / CBC - Monitor closely for s/sx of bleeding    [place] Jr D. Laney Potash, PharmD, BCPS Pager:  [phone] 07/06/2013, 3:12 PM

## 2013-07-06 NOTE — ED Notes (Signed)
Pt was at PCP when she had sudden onset chest pain with radiation to bilateral upper arms. CP is currently gone, but upper arm pain persist. Pt received 324 mg of aspirin.

## 2013-07-06 NOTE — ED Notes (Signed)
Pt sitting in bed eating sandwich

## 2013-07-06 NOTE — ED Notes (Addendum)
Pt c/o onset SSCP past 30 minutes, 7/10.  Repeat EKG done, given EKG to Dr. Loretha Stapler

## 2013-07-06 NOTE — ED Notes (Signed)
Attempted to call report, RN will call back.

## 2013-07-07 ENCOUNTER — Encounter (HOSPITAL_COMMUNITY): Payer: Self-pay | Admitting: Cardiology

## 2013-07-07 ENCOUNTER — Other Ambulatory Visit: Payer: Self-pay

## 2013-07-07 ENCOUNTER — Encounter (HOSPITAL_COMMUNITY)
Admission: EM | Disposition: A | Discharge status: Home / Self Care | Payer: Medicare Other | Source: Home / Self Care | Attending: Internal Medicine

## 2013-07-07 DIAGNOSIS — I2510 Atherosclerotic heart disease of native coronary artery without angina pectoris: Secondary | ICD-10-CM | POA: Diagnosis present

## 2013-07-07 DIAGNOSIS — I059 Rheumatic mitral valve disease, unspecified: Secondary | ICD-10-CM

## 2013-07-07 DIAGNOSIS — I214 Non-ST elevation (NSTEMI) myocardial infarction: Secondary | ICD-10-CM

## 2013-07-07 DIAGNOSIS — I200 Unstable angina: Secondary | ICD-10-CM | POA: Diagnosis present

## 2013-07-07 DIAGNOSIS — I219 Acute myocardial infarction, unspecified: Secondary | ICD-10-CM | POA: Diagnosis present

## 2013-07-07 DIAGNOSIS — Z951 Presence of aortocoronary bypass graft: Secondary | ICD-10-CM

## 2013-07-07 DIAGNOSIS — I25810 Atherosclerosis of coronary artery bypass graft(s) without angina pectoris: Secondary | ICD-10-CM

## 2013-07-07 DIAGNOSIS — I10 Essential (primary) hypertension: Secondary | ICD-10-CM | POA: Diagnosis present

## 2013-07-07 HISTORY — PX: PERCUTANEOUS CORONARY STENT INTERVENTION (PCI-S): SHX5485

## 2013-07-07 HISTORY — PX: LEFT HEART CATHETERIZATION WITH CORONARY/GRAFT ANGIOGRAM: SHX5450

## 2013-07-07 LAB — CBC
MCH: 25.9 pg — ABNORMAL LOW (ref 26.0–34.0)
MCV: 82.1 fL (ref 78.0–100.0)
Platelets: 317 10*3/uL (ref 150–400)
RBC: 4.13 MIL/uL (ref 3.87–5.11)
RDW: 15.5 % (ref 11.5–15.5)
WBC: 9.8 10*3/uL (ref 4.0–10.5)

## 2013-07-07 LAB — BASIC METABOLIC PANEL
Calcium: 8.4 mg/dL (ref 8.4–10.5)
GFR calc Af Amer: >90 mL/min (ref >90)
GFR calc non Af Amer: 79 mL/min — ABNORMAL LOW (ref >90)
Glucose, Bld: 97 mg/dL (ref 70–99)
Sodium: 134 mEq/L — ABNORMAL LOW (ref 135–145)

## 2013-07-07 LAB — TROPONIN I: Troponin I: 3.2 ng/mL (ref <0.30)

## 2013-07-07 LAB — LIPID PANEL
HDL: 37 mg/dL — ABNORMAL LOW (ref >39)
Total CHOL/HDL Ratio: 4.6 RATIO
Triglycerides: 98 mg/dL (ref <150)
VLDL: 20 mg/dL (ref 0–40)

## 2013-07-07 LAB — POCT ACTIVATED CLOTTING TIME: Activated Clotting Time: 459 seconds

## 2013-07-07 SURGERY — LEFT HEART CATHETERIZATION WITH CORONARY/GRAFT ANGIOGRAM

## 2013-07-07 MED ORDER — CLOPIDOGREL BISULFATE 300 MG PO TABS
ORAL_TABLET | ORAL | Status: AC
  Filled 2013-07-07: qty 1

## 2013-07-07 MED ORDER — BIVALIRUDIN 250 MG IV SOLR
INTRAVENOUS | Status: AC
Start: 2013-07-07 — End: 2013-07-07
  Filled 2013-07-07: qty 250

## 2013-07-07 MED ORDER — SODIUM CHLORIDE 0.9 % IJ SOLN
3.0000 mL | Freq: Two times a day (BID) | INTRAMUSCULAR | Status: DC
Start: 2013-07-07 — End: 2013-07-07

## 2013-07-07 MED ORDER — QUETIAPINE 12.5 MG HALF TABLET
12.5000 mg | ORAL_TABLET | Freq: Once | ORAL | Status: AC
  Administered 2013-07-07: 12.5 mg via ORAL
  Filled 2013-07-07: qty 1

## 2013-07-07 MED ORDER — VERAPAMIL HCL 2.5 MG/ML IV SOLN
INTRAVENOUS | Status: AC
  Filled 2013-07-07: qty 2

## 2013-07-07 MED ORDER — SODIUM CHLORIDE 0.9 % IV SOLN
1.0000 mL/kg/h | INTRAVENOUS | Status: AC
  Administered 2013-07-07: 15:00:00 1 mL/kg/h via INTRAVENOUS

## 2013-07-07 MED ORDER — NITROGLYCERIN 0.2 MG/ML ON CALL CATH LAB
INTRAVENOUS | Status: AC
  Filled 2013-07-07: qty 1

## 2013-07-07 MED ORDER — SODIUM CHLORIDE 0.9 % IV SOLN: 1.0000 mL/kg/h | INTRAVENOUS | Status: DC

## 2013-07-07 MED ORDER — LIDOCAINE HCL (PF) 1 % IJ SOLN
INTRAMUSCULAR | Status: AC
  Filled 2013-07-07: qty 30

## 2013-07-07 MED ORDER — CLOPIDOGREL BISULFATE 75 MG PO TABS
75.0000 mg | ORAL_TABLET | Freq: Every day | ORAL | Status: DC
  Administered 2013-07-08 – 2013-07-09 (×2): 75 mg via ORAL
  Filled 2013-07-07 (×2): qty 1

## 2013-07-07 MED ORDER — HEPARIN (PORCINE) IN NACL 2-0.9 UNIT/ML-% IJ SOLN
INTRAMUSCULAR | Status: AC
  Filled 2013-07-07: qty 500

## 2013-07-07 MED ORDER — HEPARIN (PORCINE) IN NACL 100-0.45 UNIT/ML-% IJ SOLN
950.0000 [IU]/h | INTRAMUSCULAR | Status: DC
  Administered 2013-07-07 – 2013-07-08 (×2): 1000 [IU]/h via INTRAVENOUS
  Filled 2013-07-07 (×4): qty 250

## 2013-07-07 MED ORDER — MIDAZOLAM HCL 2 MG/2ML IJ SOLN
INTRAMUSCULAR | Status: AC
  Filled 2013-07-07: qty 2

## 2013-07-07 MED ORDER — HEPARIN (PORCINE) IN NACL 2-0.9 UNIT/ML-% IJ SOLN
INTRAMUSCULAR | Status: AC
  Filled 2013-07-07: qty 1000

## 2013-07-07 MED ORDER — QUETIAPINE 12.5 MG HALF TABLET
12.5000 mg | ORAL_TABLET | Freq: Every day | ORAL | Status: DC
  Administered 2013-07-07 – 2013-07-08 (×2): 12.5 mg via ORAL
  Filled 2013-07-07 (×3): qty 1

## 2013-07-07 MED ORDER — ASPIRIN 81 MG PO CHEW: 81.0000 mg | CHEWABLE_TABLET | ORAL | Status: DC

## 2013-07-07 MED ORDER — SODIUM CHLORIDE 0.9 % IV SOLN: 250.0000 mL | INTRAVENOUS | Status: DC | PRN

## 2013-07-07 MED ORDER — DIAZEPAM 5 MG PO TABS
5.0000 mg | ORAL_TABLET | ORAL | Status: AC
  Administered 2013-07-07: 5 mg via ORAL
  Filled 2013-07-07: qty 1

## 2013-07-07 MED ORDER — SODIUM CHLORIDE 0.9 % IJ SOLN: 3.0000 mL | INTRAMUSCULAR | Status: DC | PRN

## 2013-07-07 NOTE — CV Procedure (Addendum)
Cardiac Catheterization Procedure Note  Name: Jasmin Vargas MRN: [id number] DOB: 11-09-1923  Procedure: Left Heart Cath, Selective Coronary Angiography, SVG angiography,  LV angiography, PTCA and stenting of the SVG to the LAD.  Indication: 79 yo WF with history of CAD s/p CABG in 1993 present with unstable angina.  Procedural Details:  The left wrist was prepped, draped, and anesthetized with 1% lidocaine. Using the modified Seldinger technique, a 5 French sheath was introduced into the left radial artery. 3 mg of verapamil was administered through the sheath, weight-based unfractionated heparin was administered intravenously. Standard Judkins catheters were used for selective coronary angiography and left ventriculography. Catheter exchanges were performed over an exchange length guidewire.  PROCEDURAL FINDINGS Hemodynamics: AO 118/60 mean 84 mm Hg LV 118/22 mm Hg   Coronary angiography: Coronary dominance: right  Left mainstem: Normal  Left anterior descending (LAD):  100% occluded at the ostium. The LAD is supplied by SVG.  Left circumflex (LCx): This is a large vessel. There is 30% disease proximally. The first and second OMs are small and normal. The third OM is large and gives off multiple branches. No significant disease.   Right coronary artery (RCA):  80% disease proximally. 100% occlusion post RV marginal vessel takeoff in the mid RCA.  The SVG to the PDA is widely patent and fills the distal RCA and PLOM.  The SVG to the LAD is patent with a focal 90% stenosis in the mid body of the SVG.   Left ventriculography: Left ventricular systolic function is abnormal, LVEF is estimated at 30-335%. There is global hypokinesis with severe hypokinesis of the anterior wall and akinesis of the apex. There is a round filling defect in the apex consistent with thrombus. There is no significant mitral regurgitation   PCI Note:  Following the diagnostic procedure, the decision was  made to proceed with PCI of the SVG to the LAD. Plavix 600 mg was given orally. Weight-based bivalirudin was given for anticoagulation. Once a therapeutic ACT was achieved, a 6 [place] FR4 guide catheter was inserted.  A Filterwire  was used to cross the lesion and the filter was deployed in the distal SVG.  The lesion was then stented with a 4.0 x 16 mm Promus stent.  The stent was postdilated with a 4.0 mm noncompliant balloon. The filter was then retrieved without difficulty. Following PCI, there was 0% residual stenosis and TIMI-3 flow. Final angiography confirmed an excellent result. The patient tolerated the procedure well. There were no immediate procedural complications. A TR band was used for radial hemostasis. The patient was transferred to the post catheterization recovery area for further monitoring.  PCI Data: Vessel - SVG-LAD/Segment - mid body Percent Stenosis (pre)  90% TIMI-flow 3 Stent 4.0 x 16 mm Promus Percent Stenosis (post) 0% TIMI-flow (post) 3  Final Conclusions:   1. Severe 2 vessel obstructive CAD 2. Patent SVG to the PDA. 3. Patent SVG to the LAD with severe stenosis in the mid graft body. 4. Severe LV dysfunction 5. Apical mural thrombus. 6. Successful stenting of the SVG to the LAD with a DES.   Recommendations:  Dual antiplatelet therapy for one month with ASA and Plavix. Will obtain Echo to further evaluate mural thrombus and LV function. Optimize meds for CHF. Will resume IV heparin and transition to coumadin for mural thrombus.  Jasmin Vargas [facility] 07/07/2013, 11:54 AM   Addendum: family reports history of rectal bleeding. If anticoagulation is needed I would consider using Plavix and  coumadin only without ASA and aim for INR of 2-2.5  Jasmin Vargas [place] MD, [facility]

## 2013-07-07 NOTE — Interval H&P Note (Signed)
History and Physical Interval Note:  07/07/2013 10:34 AM  Ashok Croon  has presented today for surgery, with the diagnosis of cp  The various methods of treatment have been discussed with the patient and family. After consideration of risks, benefits and other options for treatment, the patient has consented to  Procedure(s): LEFT HEART CATHETERIZATION WITH CORONARY ANGIOGRAM (N/A) as a surgical intervention .  The patient's history has been reviewed, patient examined, no change in status, stable for surgery.  I have reviewed the patient's chart and labs.  Questions were answered to the patient's satisfaction.    Cath Lab Visit (complete for each Cath Lab visit)  Clinical Evaluation Leading to the Procedure:   ACS: yes  Non-ACS:    Anginal Classification: CCS III  Anti-ischemic medical therapy: Maximal Therapy (2 or more classes of medications)  Non-Invasive Test Results: No non-invasive testing performed  Prior CABG: Previous CABG       Theron Arista [facility] 07/07/2013 10:34 AM

## 2013-07-07 NOTE — Progress Notes (Addendum)
Pt complaining of CP 5/10.  EKG obtained no changes, total 2 SL NTG given with relief.  MD notified.  Pt also with anxiety and sleeplessness, MD ordered Seroquel.  Vital signs stable, will cont to monitor closley

## 2013-07-07 NOTE — Progress Notes (Signed)
Patient ID: Khaya L Viruet, female   DOB: 01/16/1924, 79 y.o.   MRN: [id number]    SUBJECTIVE:  I reviewed the record and called and spoke with the patient's nurse this morning. The patient is elderly but quite active. She did have some pain during the night. The nurses felt that she was anxious. Nitroglycerin relieved the pain. Recommendation was made yesterday by Dr. Ross to proceed with catheterization today. I called the cath lab this morning to be sure that the patient was the second case for Dr. Jordan. Enzymes are abnormal. EKGs have shown variation in the anterior T wave changes. I completed the cath orders. The patient is for cath this morning.   Jeff Kasara Schomer, MD 

## 2013-07-07 NOTE — Progress Notes (Signed)
ANTICOAGULATION CONSULT NOTE - Follow Up Consult  Pharmacy Consult for heparin  Indication: ACS / mural thrombus  Allergies  Allergen Reactions  . Codeine Other (See Comments)    "Nightmares"  . Sulfa Antibiotics Other (See Comments)    GI     Patient Measurements: Height: 5\' 4"  (162.6 cm) Weight: 150 lb 12.7 oz (68.4 kg) IBW/kg (Calculated) : 54.7 Heparin Dosing Weight: 69kg  Vital Signs: Temp: 98.4 F (36.9 C) (12/19 0400) Temp src: Oral (12/19 0400) BP: 118/99 mmHg (12/19 1000) Pulse Rate: 81 (12/19 1028)  Labs:  Recent Labs  07/06/13 1337 07/07/13 0050 07/07/13 0530  HGB 11.6* 10.7*  --   HCT 36.5 33.9*  --   PLT 361 317  --   HEPARINUNFRC  --  0.17*  --   CREATININE 0.72 0.62  --   TROPONINI 1.44* 2.29* 2.20*    Estimated Creatinine Clearance: 47.1 ml/min (by C-G formula based on Cr of 0.62).   Medications:  Scheduled:  . aspirin  324 mg Oral NOW   Or  . aspirin  300 mg Rectal NOW  . aspirin EC  81 mg Oral Daily  . atorvastatin  80 mg Oral q1800  . metoprolol tartrate  6.25 mg Oral TID WC & HS   Infusions:  . nitroGLYCERIN      Assessment: 79 yo female with ACS and mural thrombus will be resumed on heparin 8hrs post sheath removal; s/p cath.  Heparin level was low this morning and drip rate was increased.  Per RN, sheath was removed at 1155.  Had history of GI bleed in the past.  Per MD, will have ECHO to further evaluate mural thrombus and LV function.  And will transition to coumadin in the near future.  When coumadin is started, MD wants to keep INR 2-2.5 Goal of Therapy:  Heparin level 0.3-0.7 units/ml Monitor platelets by anticoagulation protocol: Yes   Plan:  1) Start heparin at 1000 units/hr at 2000. No bolus 2) 8hr heparin level  3) Daily heparin level and CBC   Danetta Prom, Tsz-Yin 07/07/2013,12:50 PM

## 2013-07-07 NOTE — Care Management Note (Signed)
    Page 1 of 1   07/07/2013     10:32:40 AM   CARE MANAGEMENT NOTE 07/07/2013  Patient:  Jasmin Vargas, Jasmin Vargas   Account Number:  [id number]  Date Initiated:  07/07/2013  Documentation initiated by:  Junius Creamer  Subjective/Objective Assessment:   adm w mi     Action/Plan:   lives w husband, pcp dr Mila Palmer   Anticipated DC Date:     Anticipated DC Plan:           Choice offered to / List presented to:             Status of service:   Medicare Important Message given?   (If response is "NO", the following Medicare IM given date fields will be blank) Date Medicare IM given:   Date Additional Medicare IM given:    Discharge Disposition:    Per UR Regulation:  Reviewed for med. necessity/level of care/duration of stay  If discussed at Long Length of Stay Meetings, dates discussed:    Comments:

## 2013-07-07 NOTE — H&P (View-Only) (Signed)
Patient ID: Jasmin Vargas, female   DOB: [birth year], 79 y.o.   MRN: [id number]    SUBJECTIVE:  I reviewed the record and called and spoke with the patient's nurse this morning. The patient is elderly but quite active. She did have some pain during the night. The nurses felt that she was anxious. Nitroglycerin relieved the pain. Recommendation was made yesterday by Dr. Tenny Craw to proceed with catheterization today. I called the cath lab this morning to be sure that the patient was the second case for Dr. [place]. Enzymes are abnormal. EKGs have shown variation in the anterior T wave changes. I completed the cath orders. The patient is for cath this morning.   Jerral Bonito, MD

## 2013-07-07 NOTE — Progress Notes (Signed)
ANTICOAGULATION CONSULT NOTE - Follow Up Consult  Pharmacy Consult for heparin Indication: NSTEMI  Labs:  Recent Labs  07/06/13 1337 07/07/13 0050  HGB 11.6* 10.7*  HCT 36.5 33.9*  PLT 361 317  HEPARINUNFRC  --  0.17*  CREATININE 0.72 0.62  TROPONINI 1.44*  --     Assessment: 79yo female subtherapeutic on heparin with initial dosing for NSTEMI; Hgb down 1 point but no overt signs of bleeding per RN.  Goal of Therapy:  Heparin level 0.3-0.7 units/ml   Plan:  Will increase heparin gtt by 3 units/kg/hr to 1000 units/hr and check level in 8hr.  Vernard Gambles, PharmD, BCPS  07/07/2013,1:43 AM

## 2013-07-07 NOTE — Progress Notes (Signed)
  Echocardiogram 2D Echocardiogram has been performed.  Arvil Chaco 07/07/2013, 5:49 PM

## 2013-07-08 LAB — BASIC METABOLIC PANEL
BUN: 9 mg/dL (ref 6–23)
Calcium: 8.7 mg/dL (ref 8.4–10.5)
Creatinine, Ser: 0.66 mg/dL (ref 0.50–1.10)
GFR calc Af Amer: 89 mL/min — ABNORMAL LOW (ref >90)
GFR calc non Af Amer: 77 mL/min — ABNORMAL LOW (ref >90)
Glucose, Bld: 95 mg/dL (ref 70–99)
Potassium: 4.1 mEq/L (ref 3.5–5.1)
Sodium: 136 mEq/L (ref 135–145)

## 2013-07-08 LAB — PROTIME-INR
INR: 1.03 (ref 0.00–1.49)
Prothrombin Time: 13.3 seconds (ref 11.6–15.2)

## 2013-07-08 LAB — HEPARIN LEVEL (UNFRACTIONATED): Heparin Unfractionated: 0.5 IU/mL (ref 0.30–0.70)

## 2013-07-08 LAB — CBC
HCT: 35.4 % — ABNORMAL LOW (ref 36.0–46.0)
MCH: 25.7 pg — ABNORMAL LOW (ref 26.0–34.0)
MCHC: 31.6 g/dL (ref 30.0–36.0)
RDW: 15.5 % (ref 11.5–15.5)

## 2013-07-08 MED ORDER — WARFARIN VIDEO
Freq: Once | Status: AC
  Administered 2013-07-09: 12:00:00

## 2013-07-08 MED ORDER — LOSARTAN POTASSIUM 25 MG PO TABS
25.0000 mg | ORAL_TABLET | Freq: Every day | ORAL | Status: DC
  Administered 2013-07-08: 09:00:00 25 mg via ORAL
  Filled 2013-07-08: qty 1

## 2013-07-08 MED ORDER — WARFARIN - PHYSICIAN DOSING INPATIENT: Freq: Every day | Status: DC

## 2013-07-08 MED ORDER — BENAZEPRIL HCL 5 MG PO TABS: 5.0000 mg | ORAL_TABLET | Freq: Every day | ORAL | Status: DC

## 2013-07-08 MED ORDER — BENAZEPRIL HCL 5 MG PO TABS
5.0000 mg | ORAL_TABLET | Freq: Every day | ORAL | Status: DC
  Administered 2013-07-09: 5 mg via ORAL
  Filled 2013-07-08: qty 1

## 2013-07-08 MED ORDER — COUMADIN BOOK
Freq: Once | Status: AC
  Administered 2013-07-08: 19:00:00
  Filled 2013-07-08: qty 1

## 2013-07-08 MED ORDER — WARFARIN SODIUM 2 MG PO TABS
2.0000 mg | ORAL_TABLET | Freq: Every day | ORAL | Status: DC
  Administered 2013-07-08: 2 mg via ORAL
  Filled 2013-07-08 (×2): qty 1

## 2013-07-08 NOTE — Progress Notes (Signed)
Patient ID: Jasmin Vargas, female   DOB: 04/09/1924, 79 y.o.   MRN: [id number] Subjective:  Denies chest pain or sob. Note episodes of confusion and agitation last night  Objective:  Vital Signs in the last 24 hours: Temp:  [98.6 F (37 C)-99.1 F (37.3 C)] 99.1 F (37.3 C) (12/20 0746) Pulse Rate:  [73-97] 89 (12/20 0746) Resp:  [18-28] 20 (12/20 0746) BP: (113-189)/(53-104) 159/77 mmHg (12/20 0746) SpO2:  [94 %-98 %] 95 % (12/20 0746) Weight:  [151 lb 10.8 oz (68.8 kg)] 151 lb 10.8 oz (68.8 kg) (12/20 0100)  Intake/Output from previous day: 12/19 0701 - 12/20 0700 In: 951.8 [P.O.:300; I.V.:651.8] Out: -  Intake/Output from this shift: Total I/O In: 240 [P.O.:240] Out: -   Physical Exam: elderly appearing NAD HEENT: Unremarkable Neck:  No JVD, no thyromegally Back:  No CVA tenderness Lungs:  Clear with no wheezes HEART:  Regular rate rhythm, no murmurs, no rubs, no clicks Abd:  soft, positive bowel sounds, no organomegally, no rebound, no guarding Ext:  2 plus pulses, no edema, no cyanosis, no clubbing Skin:  No rashes no nodules Neuro:  CN II through XII intact, motor grossly intact, answers questions appropriately  Lab Results:  Recent Labs  07/07/13 0050 07/08/13 0354  WBC 9.8 9.8  HGB 10.7* 11.2*  PLT 317 332    Recent Labs  07/07/13 0050 07/08/13 0354  NA 134* 136  K 4.1 4.1  CL 101 102  CO2 26 25  GLUCOSE 97 95  BUN 12 9  CREATININE 0.62 0.66    Recent Labs  07/07/13 0530 07/07/13 2107  TROPONINI 2.20* 3.20*   Hepatic Function Panel No results found for this basename: PROT, ALBUMIN, AST, ALT, ALKPHOS, BILITOT, BILIDIR, IBILI,  in the last 72 hours  Recent Labs  07/07/13 0050  CHOL 169   No results found for this basename: PROTIME,  in the last 72 hours  Imaging: Dg Chest Port 1 View  07/06/2013   CLINICAL DATA:  Chest pain.  EXAM: PORTABLE CHEST - 1 VIEW  COMPARISON:  None.  FINDINGS: Portable view of the chest demonstrates  elevation of the right hemidiaphragm. Postsurgical changes in the sternum and right axilla. Lungs are clear without airspace disease or edema. Heart size is within normal limits. Negative for a pneumothorax.  IMPRESSION: Postoperative changes in the chest. No acute cardiopulmonary disease.   Electronically Signed   By: Richarda Overlie M.D.   On: 07/06/2013 13:48    Cardiac Studies: Tele - nsr Assessment/Plan:  1. NSTEMI, s/p PCI stent SVG to LAD 2. ICM, with echo demonstrating an EF 30% and ? mural thrombus 3. Acute onset of confusion, agitation, both now better. I suspect some sundowning and residual from conscious sedation from her procedure.  4. H/o epistaxis on ASA Rec: will keep in hospital today. Avoid benzos. Continue beta blocker and ACE inhibitor. Follow kidney function. I spent 30 additional minutes speaking to her daughter about expectations. Big problem is risk of bleeding. She has a fresh stent and will need plavix and ASA. With her mural thrombus, she is at risk for stroke and is on IV heparin. Will start coumadin. She will need to be watched very carefully. Long term, I think 3 months of coumadin before switching to plavix alone or plavix and a baby ASA. Prognosis guarded.  LOS: 2 days    Almeter Westhoff,M.D. 07/08/2013, 9:31 AM

## 2013-07-08 NOTE — Progress Notes (Signed)
ANTICOAGULATION CONSULT NOTE - Follow Up Consult  Pharmacy Consult for heparin  Indication: ACS / mural thrombus  Allergies  Allergen Reactions  . Codeine Other (See Comments)    "Nightmares"  . Sulfa Antibiotics Other (See Comments)    GI     Patient Measurements: Height: 5\' 4"  (162.6 cm) Weight: 151 lb 10.8 oz (68.8 kg) IBW/kg (Calculated) : 54.7 Heparin Dosing Weight: 69kg  Vital Signs: Temp: 99.1 F (37.3 C) (12/20 0746) Temp src: Oral (12/20 0746) BP: 159/77 mmHg (12/20 0746) Pulse Rate: 89 (12/20 0746)  Labs:  Recent Labs  07/06/13 1337 07/07/13 0050 07/07/13 0530 07/07/13 2107 07/08/13 0354 07/08/13 1146 07/08/13 1200  HGB 11.6* 10.7*  --   --  11.2*  --   --   HCT 36.5 33.9*  --   --  35.4*  --   --   PLT 361 317  --   --  332  --   --   LABPROT  --   --   --   --   --  13.3  --   INR  --   --   --   --   --  1.03  --   HEPARINUNFRC  --  0.17*  --   --  0.50  --  0.63  CREATININE 0.72 0.62  --   --  0.66  --   --   TROPONINI 1.44* 2.29* 2.20* 3.20*  --   --   --     Estimated Creatinine Clearance: 47.2 ml/min (by C-G formula based on Cr of 0.66).   Medications:  Scheduled:  . aspirin EC  81 mg Oral Daily  . atorvastatin  80 mg Oral q1800  . [START ON 07/09/2013] benazepril  5 mg Oral Daily  . clopidogrel  75 mg Oral Q breakfast  . metoprolol tartrate  6.25 mg Oral TID WC & HS  . QUEtiapine  12.5 mg Oral QHS  . warfarin  2 mg Oral q1800  . Warfarin - Physician Dosing Inpatient   Does not apply q1800   Infusions:  . heparin 1,000 Units/hr (07/07/13 2100)   Assessment: 79 yo female with ACS and mural thrombus now resumed on heparin s/p cath.  Patient has a history of GI bleed in the past. MD is dosing coumadin as well, with a goal INR of 2-2.5. Patient is also on plavix and aspirin.   Heparin level was therapeutic this morning at 0.63.    No bleeding issues noted. CBC stable: Hgb 11.2, platelets 332.    Goal of Therapy:  Heparin level  0.3-0.7 units/ml Monitor platelets by anticoagulation protocol: Yes   Plan:  1) Continue heparin at 1000 units/hr  2) Daily heparin level and CBC   Tykwon Fera C. Katrell Milhorn, PharmD Clinical Pharmacist-Resident Pager: [phone] Pharmacy: [phone] 07/08/2013 1:10 PM

## 2013-07-08 NOTE — Progress Notes (Signed)
ANTICOAGULATION CONSULT NOTE - Follow Up Consult  Pharmacy Consult for heparin Indication: mural thrombus  Labs:  Recent Labs  07/06/13 1337 07/07/13 0050 07/07/13 0530 07/07/13 2107 07/08/13 0354  HGB 11.6* 10.7*  --   --  11.2*  HCT 36.5 33.9*  --   --  35.4*  PLT 361 317  --   --  332  HEPARINUNFRC  --  0.17*  --   --  0.50  CREATININE 0.72 0.62  --   --  0.66  TROPONINI 1.44* 2.29* 2.20* 3.20*  --     Assessment: 79yo female now therapeutic on heparin after resumed post-cath.  Will continue gtt at current rate and confirm stable with additional level.  Vernard Gambles, PharmD, BCPS  07/08/2013,5:14 AM

## 2013-07-08 NOTE — Progress Notes (Signed)
CARDIAC REHAB PHASE I   PRE:  Rate/Rhythm: 92 SR  BP:  Supine: 159/77  Sitting:   Standing:    SaO2:   MODE:  Ambulation: 90 ft   POST:  Rate/Rhythm: 121 ST  BP:  Supine:   Sitting: 157/79  Standing:    SaO2:  0730-0840 On arrival pt is disoriented to place and time, uncooperative. We were able to reorient her and she became cooperative. Assisted X 1 and used walker to ambulate pt. Gait steady with walker, slow pace. Pt was able to walk 90 feet without c/o of cp or SOB. VS stable Pt to chair after walk with chair alarm on and daughter present. I started MI, CHF and stent education with her daughter. Pt cares for two daughters at home that are developmentally challenged. The daughter present is legal guardian and is now concerned that her mother is now not going to be able to handle the situation at home. I discussed this with her RN she is going to ask case manger and social worker to see daughter. We will continue to follow pt.  Melina Copa RN 07/08/2013 8:39 AM

## 2013-07-09 ENCOUNTER — Encounter (HOSPITAL_COMMUNITY): Payer: Self-pay | Admitting: Physician Assistant

## 2013-07-09 LAB — HEPATIC FUNCTION PANEL
ALT: 8 U/L (ref 0–35)
AST: 22 U/L (ref 0–37)
Albumin: 2.7 g/dL — ABNORMAL LOW (ref 3.5–5.2)
Alkaline Phosphatase: 43 U/L (ref 39–117)
Bilirubin, Direct: <0.1 mg/dL (ref 0.0–0.3)
Total Bilirubin: 0.3 mg/dL (ref 0.3–1.2)
Total Protein: 5.5 g/dL — ABNORMAL LOW (ref 6.0–8.3)

## 2013-07-09 LAB — CBC
HCT: 32.6 % — ABNORMAL LOW (ref 36.0–46.0)
Hemoglobin: 10.4 g/dL — ABNORMAL LOW (ref 12.0–15.0)
MCH: 25.9 pg — ABNORMAL LOW (ref 26.0–34.0)
MCHC: 31.9 g/dL (ref 30.0–36.0)
MCV: 81.3 fL (ref 78.0–100.0)
RBC: 4.01 MIL/uL (ref 3.87–5.11)
RDW: 15.7 % — ABNORMAL HIGH (ref 11.5–15.5)

## 2013-07-09 LAB — BASIC METABOLIC PANEL
BUN: 10 mg/dL (ref 6–23)
CO2: 25 mEq/L (ref 19–32)
Calcium: 8.4 mg/dL (ref 8.4–10.5)
Creatinine, Ser: 0.66 mg/dL (ref 0.50–1.10)
GFR calc non Af Amer: 77 mL/min — ABNORMAL LOW (ref >90)
Glucose, Bld: 95 mg/dL (ref 70–99)
Sodium: 136 mEq/L (ref 135–145)

## 2013-07-09 LAB — MAGNESIUM: Magnesium: 1.9 mg/dL (ref 1.5–2.5)

## 2013-07-09 LAB — PROTIME-INR: INR: 1.06 (ref 0.00–1.49)

## 2013-07-09 MED ORDER — ATORVASTATIN CALCIUM 40 MG PO TABS: 40.0000 mg | ORAL_TABLET | Freq: Every evening | ORAL | Status: AC

## 2013-07-09 MED ORDER — CLOPIDOGREL BISULFATE 75 MG PO TABS: 75.0000 mg | ORAL_TABLET | Freq: Every day | ORAL | Status: AC

## 2013-07-09 MED ORDER — METOPROLOL SUCCINATE ER 25 MG PO TB24: 25.0000 mg | ORAL_TABLET | Freq: Every day | ORAL | Status: AC

## 2013-07-09 MED ORDER — BENAZEPRIL HCL 5 MG PO TABS: 5.0000 mg | ORAL_TABLET | Freq: Every day | ORAL | Status: DC

## 2013-07-09 MED ORDER — WARFARIN SODIUM 5 MG PO TABS: 5.0000 mg | ORAL_TABLET | Freq: Every day | ORAL | Status: AC

## 2013-07-09 MED ORDER — NITROGLYCERIN 0.4 MG SL SUBL: 0.4000 mg | SUBLINGUAL_TABLET | SUBLINGUAL | Status: AC | PRN

## 2013-07-09 NOTE — Progress Notes (Signed)
ANTICOAGULATION CONSULT NOTE - Follow Up Consult  Pharmacy Consult for heparin  Indication: ACS / mural thrombus  Allergies  Allergen Reactions  . Codeine Other (See Comments)    "Nightmares"  . Sulfa Antibiotics Other (See Comments)    GI     Patient Measurements: Height: 5\' 4"  (162.6 cm) Weight: 151 lb 10.8 oz (68.8 kg) IBW/kg (Calculated) : 54.7 Heparin Dosing Weight: 69kg  Vital Signs: Temp: 98.4 F (36.9 C) (12/21 0511) Temp src: Oral (12/21 0511) BP: 123/59 mmHg (12/21 0511) Pulse Rate: 84 (12/21 0511)  Labs:  Recent Labs  07/07/13 0050 07/07/13 0530 07/07/13 2107 07/08/13 0354 07/08/13 1146 07/08/13 1200 07/09/13 0522  HGB 10.7*  --   --  11.2*  --   --  10.4*  HCT 33.9*  --   --  35.4*  --   --  32.6*  PLT 317  --   --  332  --   --  303  LABPROT  --   --   --   --  13.3  --  13.6  INR  --   --   --   --  1.03  --  1.06  HEPARINUNFRC 0.17*  --   --  0.50  --  0.63 0.70  CREATININE 0.62  --   --  0.66  --   --  0.66  TROPONINI 2.29* 2.20* 3.20*  --   --   --   --     Estimated Creatinine Clearance: 47.2 ml/min (by C-G formula based on Cr of 0.66).   Medications:  Scheduled:  . aspirin EC  81 mg Oral Daily  . atorvastatin  80 mg Oral q1800  . benazepril  5 mg Oral Daily  . clopidogrel  75 mg Oral Q breakfast  . metoprolol tartrate  6.25 mg Oral TID WC & HS  . QUEtiapine  12.5 mg Oral QHS  . warfarin  2 mg Oral q1800  . warfarin   Does not apply Once  . Warfarin - Physician Dosing Inpatient   Does not apply q1800   Infusions:  . heparin 1,000 Units/hr (07/08/13 1527)   Assessment: 79 yo female with ACS and mural thrombus now resumed on heparin s/p cath.  Patient has a history of GI bleed in the past. MD is dosing coumadin as well, with a goal INR of 2-2.5. Patient is also on plavix and aspirin.   Heparin level was on the high end of the therapeutic range this morning at 0.7. As her heparin level has continued to increase despite a constant  rate, dose will be slightly decreased to avoid patient becoming supratherapeutic.  No bleeding issues noted. CBC fairly stable: Hgb 10.4, platelets 303. INR 1.06   Goal of Therapy:  Heparin level 0.3-0.7 units/ml Monitor platelets by anticoagulation protocol: Yes   Plan:  1) Decrease heparin to 950 units/hr  2) Check 8 hour heparin level 2) Daily heparin level and CBC   Sascha Palma C. Antjuan Rothe, PharmD Clinical Pharmacist-Resident Pager: [phone] Pharmacy: [phone] 07/09/2013 8:08 AM

## 2013-07-09 NOTE — Discharge Summary (Signed)
Discharge Summary   Patient ID: Jasmin Vargas MRN: [id number], DOB/AGE: 10/29/1923 79 y.o. Admit date: 07/06/2013 D/C date:     07/09/2013  Primary Care Provider: Emeterio Reeve, MD Primary Cardiologist: Tenny Craw  Primary Discharge Diagnoses:  1. NSTEMI/CAD - cath this admission: s/p DES to SVG to the LAD - history: MI in 1993, prior CABG 2. Ischemic cardiomyopathy EF 30-35% 3. LV apical thrombus, dx this admission  Secondary Discharge Diagnoses:  1. Glaucoma 2. History of UTIs 3. History of urinary incontinence 4. Breast cancer 5. History of rectal bleeding 6. History of epistaxis  Hospital Course: Jasmin Vargas is a 79 y/o F with history of CAD s/p remote CABG, remote breast CA who presented to [facility] 07/06/2013 with chest pain intermittent for the last week. It was sometimes associated with arm discomfort. It was not exacerbated by breathing or movement and she had no SOB or diaphoresis. She saw her primary care doctor where ECG showed T wave inversion in I, avl and V2 that was new. She was subsequently referred to the ER. Initial troponin was 1.4. She was started on IV heparin, lopressor and ASA. LHC was recommended. She underwent this procedure 07/07/13 showing 1. Severe 2 vessel obstructive CAD  2. Patent SVG to the PDA.  3. Patent SVG to the LAD with severe stenosis in the mid graft body.  4. Severe LV dysfunction EF 30-35% 5. Apical mural thrombus.  6. Successful stenting of the SVG to the LAD with a DES.  DAPT for 1 month with ASA/Plavix was recommended but the issue of anticoagulation had to be settled given history of rectal bleeding and epistaxis. She did have some confusion and agitation post cath but this improved. Meds were optimized for CHF. 2D Echo confirmed probable clot of questionable age. She had mild AI and mild MR. Dr. Ladona Ridgel had long discussion with the patient's daughter. The decision was made to proceed with careful anticoagulation due to risk of  stroke from mural thrombus. Coumadin was started. The patient is doing better today. At discharge Dr. Wyline Mood elected not to bridge with Lovenox because of higher risk than benefit given her neccesity for DAPT. She has seen and examined the patient today and feels she is stable for discharge. Dr. Wyline Mood would like to defer the potential decision of discontinuing one of her 3 agents to her primary cardiologist as an outpatient. For now he recommends continuing ASA, Plavix, Coumadin.  I have submitted the patient's information to our office to call them for their followup appointment - she will have TOC visit 77 week given NSTEMI. Per discussion with pharmacy, recommend sending home on Coumadin 5mg  daily with INR check early this week either tomorrow or the next day - I submitted this info to office AND left voicemail on after hours line to make sure it gets done. Goal INR 2-2.5. She was instructed to let us know if she has any bleeding at all.  She was also started on statin this admission. Hepatic function panel will be sent day of discharge for baseline. Consider OP f/u labs given initiation.  Discharge Vitals: Blood pressure 123/59, pulse 84, temperature 98.4 F (36.9 C), temperature source Oral, resp. rate 15, height 5\' 4"  (1.626 m), weight 151 lb 10.8 oz (68.8 kg), SpO2 97.00%.  Labs: Lab Results  Component Value Date   WBC 8.0 07/09/2013   HGB 10.4* 07/09/2013   HCT 32.6* 07/09/2013   MCV 81.3 07/09/2013   PLT 303 07/09/2013  Recent Labs Lab 07/09/13 0522  NA 136  K 4.1  CL 102  CO2 25  BUN 10  CREATININE 0.66  CALCIUM 8.4  GLUCOSE 95    Recent Labs  07/06/13 1337 07/07/13 0050 07/07/13 0530 07/07/13 2107  TROPONINI 1.44* 2.29* 2.20* 3.20*   Lab Results  Component Value Date   CHOL 169 07/07/2013   HDL 37* 07/07/2013   LDLCALC 112* 07/07/2013   TRIG 98 07/07/2013    Diagnostic Studies/Procedures   Cardiac catheterization this admission, please see full report  and above for summary.  2D Echo 07/07/13 - Left ventricle: Severe hypokinesis of the anterior septum, anterior wall and inferior septum. The inferior wall, inferolateral wall and anterolateral walls move with mild hypokinesis. There is a target along the apical anterior segment that may be calcified. I worder if this is old clot. The apical trabeculae are calcified. There may be a small area of clot at the apical cap.. It isdifficult to be definitive about these findings. The apex is abnormal. There is probably clot of ?age. I do not see any mobile areas. The cavity size was at the upper limits of normal. The estimated ejection fraction was 30%. - Aortic valve: Mild regurgitation. - Mitral valve: Mild regurgitation. - Left atrium: The atrium was mildly dilated.   Dg Chest Port 1 View 07/06/2013   CLINICAL DATA:  Chest pain.  EXAM: PORTABLE CHEST - 1 VIEW  COMPARISON:  None.  FINDINGS: Portable view of the chest demonstrates elevation of the right hemidiaphragm. Postsurgical changes in the sternum and right axilla. Lungs are clear without airspace disease or edema. Heart size is within normal limits. Negative for a pneumothorax.  IMPRESSION: Postoperative changes in the chest. No acute cardiopulmonary disease.   Electronically Signed   By: Richarda Overlie M.D.   On: 07/06/2013 13:48    Discharge Medications     Medication List    STOP taking these medications       metoprolol tartrate 25 MG tablet  Commonly known as:  LOPRESSOR     Olive Leaf Extract 500 MG Caps      TAKE these medications       aspirin EC 81 MG tablet  Take 81 mg by mouth daily.     atorvastatin 40 MG tablet  Commonly known as:  LIPITOR  Take 1 tablet (40 mg total) by mouth every evening.     benazepril 5 MG tablet  Commonly known as:  LOTENSIN  Take 1 tablet (5 mg total) by mouth daily.     cholecalciferol 1000 UNITS tablet  Commonly known as:  VITAMIN D  Take 2,000 Units by mouth at bedtime.      clopidogrel 75 MG tablet  Commonly known as:  PLAVIX  Take 1 tablet (75 mg total) by mouth daily with breakfast.     CoQ-10 200 MG Caps  Take 1 capsule by mouth daily.     Cranberry 450 MG Tabs  Take 900 mg by mouth at bedtime.     diphenhydrAMINE 12.5 MG chewable tablet  Commonly known as:  BENADRYL  Chew 12.5 mg by mouth at bedtime as needed for sleep.     latanoprost 0.005 % ophthalmic solution  Commonly known as:  XALATAN  Place 1 drop into the left eye at bedtime.     Magnesium 100 MG Tabs  Take 600 mg by mouth at bedtime.     metoprolol succinate 25 MG 24 hr tablet  Commonly known as:  TOPROL XL  Take 1 tablet (25 mg total) by mouth daily.     neomycin-polymyxin b-dexamethasone 3.5-10000-0.1 Oint  Commonly known as:  MAXITROL  Place 1 application into the right eye at bedtime.     nitroGLYCERIN 0.4 MG SL tablet  Commonly known as:  NITROSTAT  Place 1 tablet (0.4 mg total) under the tongue every 5 (five) minutes as needed for chest pain (up to 3 doses).     vitamin C 500 MG tablet  Commonly known as:  ASCORBIC ACID  Take 1,000 mg by mouth at bedtime.     warfarin 5 MG tablet  Commonly known as:  COUMADIN  Take 1 tablet (5 mg total) by mouth daily. Or as directed.      AVS instructions for warfarin read: "You will take Coumadin 5mg  one tablet daily for now. Our office will contact you for an appointment to check your Coumadin level either tomorrow or the next day to see if your dose needs adjusting."  Disposition   The patient will be discharged in stable condition to home. Discharge Orders   Future Orders Complete By Expires   Diet - low sodium heart healthy  As directed    Discharge instructions  As directed    Comments:     If you notice any unusual bleeding at all, call your doctor immediately.  One of your heart tests showed weakness of the heart muscle this admission. This may make you more susceptible to weight gain from fluid retention, which can lead  to symptoms that we call heart failure.   For patients with congestive heart failure, we give them these special instructions:  1. Follow a low-salt diet and watch your fluid intake. In general, you should not be taking in more than 2 liters of fluid per day (no more than 8 glasses per day). Some patients are restricted to less than 1.5 liters of fluid per day (no more than 6 glasses per day). This includes sources of water in foods like soup, coffee, tea, milk, etc. 2. Weigh yourself on the same scale at same time of day and keep a log. 3. Call your doctor: (Anytime you feel any of the following symptoms)  - 3-4 pound weight gain in 1-2 days or 2 pounds overnight  - Shortness of breath, with or without a dry hacking cough  - Swelling in the hands, feet or stomach  - If you have to sleep on extra pillows at night in order to breathe  IT IS IMPORTANT TO LET YOUR DOCTOR KNOW EARLY ON IF YOU ARE HAVING SYMPTOMS SO WE CAN HELP YOU!   Increase activity slowly  As directed    Comments:     No driving for 1 week. If you have any more episodes of confusion outside the hospital, you are not to drive until cleared by your doctor. No lifting over 10 lbs for 2 weeks. No sexual activity for 2 weeks. Keep procedure site clean & dry. If you notice increased pain, swelling, bleeding or pus, call/return!  You may shower, but no soaking baths/hot tubs/pools for 1 week.         Duration of Discharge Encounter: Greater than 30 minutes including physician and PA time.  Signed, Dayna Dunn PA-C 07/09/2013, 12:12 PM

## 2013-07-09 NOTE — Progress Notes (Signed)
Patient ID: Jasmin Vargas, female   DOB: 05-04-1924, 79 y.o.   MRN: [id number]    Primary cardiologist:  Subjective:   No complaints   Objective:   Temp:  [98.4 F (36.9 C)-98.6 F (37 C)] 98.4 F (36.9 C) (12/21 0511) Pulse Rate:  [84-103] 84 (12/21 0511) Resp:  [15-18] 15 (12/21 0511) BP: (105-124)/(53-61) 123/59 mmHg (12/21 0511) SpO2:  [96 %-98 %] 97 % (12/21 0511) Last BM Date: 07/06/13  Filed Weights   07/06/13 1500 07/06/13 2148 07/08/13 0100  Weight: 153 lb (69.4 kg) 150 lb 12.7 oz (68.4 kg) 151 lb 10.8 oz (68.8 kg)    Intake/Output Summary (Last 24 hours) at 07/09/13 1050 Last data filed at 07/09/13 0900  Gross per 24 hour  Intake    720 ml  Output    400 ml  Net    320 ml  :  Exam:  General:NAD  Resp:CTAB  Cardiac:RRR, no m/r/g, no JVD  EX:BMWU, NT, ND  MSK: extremities warm, no edema  Neuro: no focal deficits    Lab Results:  Basic Metabolic Panel:  Recent Labs Lab 07/07/13 0050 07/08/13 0354 07/09/13 0522  NA 134* 136 136  K 4.1 4.1 4.1  CL 101 102 102  CO2 26 25 25   GLUCOSE 97 95 95  BUN 12 9 10   CREATININE 0.62 0.66 0.66  CALCIUM 8.4 8.7 8.4    Liver Function Tests: No results found for this basename: AST, ALT, ALKPHOS, BILITOT, PROT, ALBUMIN,  in the last 168 hours  CBC:  Recent Labs Lab 07/07/13 0050 07/08/13 0354 07/09/13 0522  WBC 9.8 9.8 8.0  HGB 10.7* 11.2* 10.4*  HCT 33.9* 35.4* 32.6*  MCV 82.1 81.4 81.3  PLT 317 332 303    Cardiac Enzymes:  Recent Labs Lab 07/07/13 0050 07/07/13 0530 07/07/13 2107  TROPONINI 2.29* 2.20* 3.20*    BNP: No results found for this basename: PROBNP,  in the last 8760 hours  Coagulation:  Recent Labs Lab 07/08/13 1146 07/09/13 0522  INR 1.03 1.06    ECG:   Medications:   Scheduled Medications: . aspirin EC  81 mg Oral Daily  . atorvastatin  80 mg Oral q1800  . benazepril  5 mg Oral Daily  . clopidogrel  75 mg Oral Q breakfast  . metoprolol tartrate   6.25 mg Oral TID WC & HS  . QUEtiapine  12.5 mg Oral QHS  . warfarin  2 mg Oral q1800  . [COMPLETED] warfarin   Does not apply Once  . Warfarin - Physician Dosing Inpatient   Does not apply q1800     Infusions: . heparin 950 Units/hr (07/09/13 0828)     PRN Medications:  acetaminophen, nitroGLYCERIN, nitroGLYCERIN, ondansetron (ZOFRAN) IV     Assessment/Plan   79 yo female with history of CAD with prior CABG in 1993 admitted with chest pain and NSTEMI  1. NSTEMI - peak trop 3.2, cath showed LM normal, LAD 100% occluded, LCX 30% proxRCA 80% prox and 100% distal. SVG to PDA was patent, SVG to LAD had focal 90% stenosis.  LVEF 30-35% with global hypokinesis and anterior wall akinesis. Filling defect concerning for thrombus in LV apex. DES to SVG to LAD.  - echo showed LVEF 30%, multiple WMAs, probable apical clot.  - patient doing well, denies any symptoms  2. ICM - LVEF 30%, initiated on medical therapy during this admission -  Will change lopressor to Toprol XL 25mg  daily, can be further titrated as  an outpatient. Continue benazepril at current dose.  3. LV apical thrombus - continue coumadin - discussed with family high bleeding risk on ASA, plavix, and coumadin. Given her clinical scenario she requires all 3 - will not bridge with lovenox, higher risk than benefit given her neccesity for DAPT. Continue coumadin, she will need to be followed in coumadin clinic  Patient will need to follow up with Dr Swaziland in 3-4 weeks. She will need to be seen in coumadin clinic in 1-2 weeks to establish care. I have asked her not to drive until Wednesday due to heavy bruising at her radial site.      Dina Rich, M.D., F.A.C.C.

## 2013-07-09 NOTE — Progress Notes (Signed)
Pt provided with dc instructions and education. Pt and husband verbalized understanding. No questions at this time. IV removed with tip intact. Heart monitor cleaned and returned to front. Pt awaiting lab and daughter to return prior to dc. Levonne Spiller, RN

## 2013-07-10 ENCOUNTER — Telehealth: Payer: Self-pay | Admitting: Cardiology

## 2013-07-10 ENCOUNTER — Ambulatory Visit (INDEPENDENT_AMBULATORY_CARE_PROVIDER_SITE_OTHER): Payer: Medicare Other | Admitting: Pharmacist

## 2013-07-10 DIAGNOSIS — I749 Embolism and thrombosis of unspecified artery: Secondary | ICD-10-CM

## 2013-07-10 DIAGNOSIS — I8290 Acute embolism and thrombosis of unspecified vein: Secondary | ICD-10-CM | POA: Insufficient documentation

## 2013-07-10 LAB — POCT INR: INR: 1.2

## 2013-07-10 MED FILL — Sodium Chloride IV Soln 0.9%: INTRAVENOUS | Qty: 50 | Status: AC

## 2013-07-10 NOTE — Telephone Encounter (Signed)
Spoke with patients husband and he states patient is doing ok. No questions or concerns and is aware of follow up appointment.

## 2013-07-10 NOTE — Telephone Encounter (Signed)
New Problem:  Per after hours voice mail.... TCM pt.. 1 week f/u with the doctor or PA.

## 2013-07-11 ENCOUNTER — Encounter: Payer: Self-pay | Admitting: *Deleted

## 2013-07-17 ENCOUNTER — Telehealth: Payer: Self-pay | Admitting: Internal Medicine

## 2013-07-17 NOTE — Telephone Encounter (Signed)
I spoke with pt & husband. States she woke up this morning with a swollen thumb. No other edema noted, no shortness of breath, no angina, no chest discomfort Denies any insect bite  Pt has not taken any Tylenol or any medication for the discomfort only ice pack. Asked pt to take tylenol as directed for the discomfort. Pt has post hospital follow-up with Lorin Picket tomorrow & Coumadin clinic appointment Mylo Red RN

## 2013-07-17 NOTE — Telephone Encounter (Signed)
New Message  Left thumb is swollen and painful// requests a call back to discuss.Carlyon Prows that it is due to the medication. Please call

## 2013-07-18 ENCOUNTER — Ambulatory Visit (INDEPENDENT_AMBULATORY_CARE_PROVIDER_SITE_OTHER): Payer: Medicare Other

## 2013-07-18 ENCOUNTER — Encounter: Payer: Self-pay | Admitting: Physician Assistant

## 2013-07-18 ENCOUNTER — Ambulatory Visit (INDEPENDENT_AMBULATORY_CARE_PROVIDER_SITE_OTHER): Payer: Medicare Other | Admitting: Physician Assistant

## 2013-07-18 VITALS — BP 110/58 | HR 77 | Ht 63.5 in | Wt 148.0 lb

## 2013-07-18 DIAGNOSIS — I10 Essential (primary) hypertension: Secondary | ICD-10-CM

## 2013-07-18 DIAGNOSIS — I255 Ischemic cardiomyopathy: Secondary | ICD-10-CM | POA: Insufficient documentation

## 2013-07-18 DIAGNOSIS — I749 Embolism and thrombosis of unspecified artery: Secondary | ICD-10-CM

## 2013-07-18 DIAGNOSIS — E785 Hyperlipidemia, unspecified: Secondary | ICD-10-CM

## 2013-07-18 DIAGNOSIS — I513 Intracardiac thrombosis, not elsewhere classified: Secondary | ICD-10-CM

## 2013-07-18 DIAGNOSIS — I5022 Chronic systolic (congestive) heart failure: Secondary | ICD-10-CM

## 2013-07-18 DIAGNOSIS — I8290 Acute embolism and thrombosis of unspecified vein: Secondary | ICD-10-CM

## 2013-07-18 DIAGNOSIS — I2510 Atherosclerotic heart disease of native coronary artery without angina pectoris: Secondary | ICD-10-CM

## 2013-07-18 LAB — BASIC METABOLIC PANEL
GFR: 90 mL/min (ref >60.00)
Potassium: 4.4 mEq/L (ref 3.5–5.1)
Sodium: 134 mEq/L — ABNORMAL LOW (ref 135–145)

## 2013-07-18 LAB — POCT INR: INR: 3.5

## 2013-07-18 NOTE — Patient Instructions (Signed)
PLEASE MAKE SURE YOU ARE TAKING BENAZEPRIL- IF YOU DO NOT HAVE THE MEDICATION AT HOME THEN CALL us BACK [phone]) ASK TO LEAVE A MESSAGE FOR [facility]/ CAROL AND I WILL SEND A PRESCRIPTION TO YOUR PHARMACY.  Your physician recommends that you return for lab work in: TODAY // BMET   Your physician recommends that you return for a FASTING lipid profile: 4 WEEKS LIPID LIVER  Your physician recommends that you schedule a follow-up appointment in: 6-7 DAY FOR A NURSE VISIT TO RECHECK YOUR LEFT THUMB FOR CHANGE IN REDNESS.  Your physician recommends that you schedule a follow-up appointment in: 1 MONTH WITH DR Swaziland -+

## 2013-07-18 NOTE — Progress Notes (Addendum)
[address] [place], [place]  [address] Phone: [phone] Fax:  [phone]  Date:  07/18/2013   ID:  Jasmin Vargas, DOB Aug 17, 1923, MRN [id number]  PCP:  Emeterio Reeve, MD  Cardiologist:  Dr. Peter Swaziland    History of Present Illness: Jasmin Vargas is a 79 y.o. female with a hx of CAD, status post remote CABG in 1993 at [facility] in [place], HTN, HL, breast CA.   She was admitted 12/18-12/21 with a non-STEMI. LHC (07/07/2013):  oLAD occluded, pCFX 30%, pRCA 80%, mid RCA occluded, SVG-PDA patent, SVG-LAD patent with mid 90% stenosis, EF 30-35% with anterior and apical HK and apical filling defect consistent with thrombus.  PCI: Promus (4 x 16 mm) DES to the mid SVG-LAD.  Echocardiogram (07/07/2013): Severe HK of the anterior septum, anterior wall and inferior septum, mild HK of the inferior wall, inferolateral wall and anterolateral wall, possible small area of clot at the apical, EF 30%, mild AI, mild MR, mild LAE.  Patient noted a hx of rectal bleeding as well as epistaxis. There was some concern (in regards to her bleeding risk) about whether the patient should be on Coumadin (for LV apical thrombus) in addition to Plavix and ASA. She was ultimately placed on all 3.  I reviewed her chart and Dr. Swaziland, who performed her PCI, did make a recommendation in his note for Plavix + Coumadin (if she required coumadin) without ASA and aim for an INR of 2-2.5.    She has overall been doing well since discharge from the hospital. She did have some left thumb swelling and redness as well as pain yesterday. This is improved today. She denies fevers or chills. She denies chest pain or shortness of breath. She denies orthopnea, PND or edema. She denies syncope.  Recent Labs: 07/07/2013: HDL 37*; LDL (calc) 112*  69/62/9528: ALT 8; Creatinine 0.66; Hemoglobin 10.4*; Potassium 4.1   Wt Readings from Last 3 Encounters:  07/18/13 148 lb (67.132 kg)  07/08/13 151 lb  10.8 oz (68.8 kg)  07/08/13 151 lb 10.8 oz (68.8 kg)     Past Medical History  Diagnosis Date  . Coronary artery disease     a. MI in 1993, prior history of CABG. b. 06/2013: NSTEMI, s/p  DES to SVG to the LAD.  Marland Kitchen Hypertension   . Artificial eye fitting/adjustment   . Myocardial infarction   . UTI (urinary tract infection)   . Breast cancer     1992 no chemo/radiation  . Ischemic cardiomyopathy     a. EF 30-35% by cath and echo this adm.  Marland Kitchen Apical mural thrombus     a. 06/2013. Placed on Coumadin cautiously given hx of epistaxis and rectal bleeding.  . Glaucoma   . Urinary incontinence     Current Outpatient Prescriptions  Medication Sig Dispense Refill  . aspirin EC 81 MG tablet Take 81 mg by mouth daily.      Marland Kitchen atorvastatin (LIPITOR) 40 MG tablet Take 1 tablet (40 mg total) by mouth every evening.  30 tablet  6  . cholecalciferol (VITAMIN D) 1000 UNITS tablet Take 2,000 Units by mouth at bedtime.      . clopidogrel (PLAVIX) 75 MG tablet Take 1 tablet (75 mg total) by mouth daily with breakfast.  30 tablet  6  . Coenzyme Q10 (COQ-10) 200 MG CAPS Take 1 capsule by mouth daily.      . Cranberry 450 MG TABS Take 900  mg by mouth at bedtime.      . diphenhydrAMINE (BENADRYL) 12.5 MG chewable tablet Chew 12.5 mg by mouth at bedtime as needed for sleep.      Marland Kitchen latanoprost (XALATAN) 0.005 % ophthalmic solution Place 1 drop into the left eye at bedtime.      . Magnesium 100 MG TABS Take 600 mg by mouth at bedtime.      . metoprolol succinate (TOPROL XL) 25 MG 24 hr tablet Take 1 tablet (25 mg total) by mouth daily.  30 tablet  6  . neomycin-polymyxin b-dexamethasone (MAXITROL) 3.5-10000-0.1 OINT Place 1 application into the right eye at bedtime.      . nitroGLYCERIN (NITROSTAT) 0.4 MG SL tablet Place 1 tablet (0.4 mg total) under the tongue every 5 (five) minutes as needed for chest pain (up to 3 doses).  25 tablet  3  . vitamin C (ASCORBIC ACID) 500 MG tablet Take 1,000 mg by mouth at  bedtime.      Marland Kitchen warfarin (COUMADIN) 5 MG tablet Take 1 tablet (5 mg total) by mouth daily. Or as directed.  30 tablet  1  . benazepril (LOTENSIN) 5 MG tablet Take 5 mg by mouth daily.       No current facility-administered medications for this visit.    Allergies:   Codeine and Sulfa antibiotics   Social History:  The patient  reports that she has never smoked. She has never used smokeless tobacco. She reports that she does not drink alcohol or use illicit drugs.   Family History:  The patient's family history is not on file.   ROS:  Please see the history of present illness.   She denies melena, hematochezia, hematuria.   All other systems reviewed and negative.   PHYSICAL EXAM: VS:  BP 110/58  Pulse 77  Ht 5' 3.5" (1.613 m)  Wt 148 lb (67.132 kg)  BMI 25.80 kg/m2 Well nourished, well developed, in no acute distress HEENT: normal Neck: no JVD at 90 Cardiac:  normal S1, S2; RRR; no murmur Lungs:  clear to auscultation bilaterally, no wheezing, rhonchi or rales Abd: soft, nontender, no hepatomegaly Ext: no edema; left wrist without hematoma or bruit; left thumb slightly edematous with slight erythema Skin: warm and dry Neuro:  CNs 2-12 intact, no focal abnormalities noted  EKG:  NSR, HR 77, T wave inversions in 1, aVL and V2, PACs, similar to prior tracings     ASSESSMENT AND PLAN:  1. CAD:  She is doing well after recent non-STEMI treated with a DES to the SVG-LAD.  No angina.  Continue aspirin and Plavix. I will review this further with Dr. Swaziland to determine whether she should remain on dual antiplatelet therapy in addition to Coumadin versus Plavix plus Coumadin only. I have encouraged her to continue to increase her mobility at home. 2. LV Apical Thrombus: Continue Coumadin. Goal INR 2-2.5. She denies any evidence of bleeding. 3. Ischemic Cardiomyopathy: Continue beta blocker. She was on an ACEI in the hospital. Her and her husband have this on her list but they are  convinced that she is not taking it at home.  I have asked her to review her medications at home. If she is not taking benazepril, this will be refilled. Check a basic metabolic panel today. 4. Chronic Systolic CHF: Volume stable. Continue current therapy. 5. Hyperlipidemia: Continue statin. Check lipids and LFTs in 4 weeks. 6. Hypertension: controlled. 7. Disposition: Follow up with Dr. Swaziland in one month.  Signed, Tereso Newcomer, PA-C  07/18/2013 12:40 PM    Addendum:  Reviewed with Dr. Peter Swaziland.  Will continue ASA, Plavix and Coumadin x 30 days.  Then, d/c ASA and remain on Plavix and Coumadin only.   Signed,  Tereso Newcomer, PA-C   07/21/2013 2:23 PM

## 2013-07-19 ENCOUNTER — Telehealth: Payer: Self-pay | Admitting: Cardiology

## 2013-07-19 NOTE — Telephone Encounter (Deleted)
ERROR

## 2013-07-19 NOTE — Telephone Encounter (Signed)
Patient seen in office yesterday. Tereso Newcomer, PA-C   07/19/2013 11:41 AM

## 2013-07-21 ENCOUNTER — Telehealth: Payer: Self-pay | Admitting: *Deleted

## 2013-07-21 MED ORDER — ASPIRIN EC 81 MG PO TBEC: 81.0000 mg | DELAYED_RELEASE_TABLET | Freq: Every day | ORAL | Status: AC

## 2013-07-21 NOTE — Telephone Encounter (Signed)
Please notify patient that I reviewed with Dr. Peter Martin[place]. She should remain on ASA, Plavix and Coumadin for total of 30 days post PCI. On 1/20, she will stop ASA and remain on Plavix and Coumadin only.  Please follow up with the patient to see if she checked her medications.  We were not certain if she was taking Benazepril or not when she was here this week.  Richardson Dopp, PA-C   07/21/2013 2:25 PM

## 2013-07-21 NOTE — Telephone Encounter (Signed)
s/w both pt and her husband about staying on ASA until 08/08/13 will be her last dose of ASA, will continue to stay on coumadin and plavix. Both verbalized understanding to Plan of Care. Pt is taking benazepril per husband. 

## 2013-07-21 NOTE — Telephone Encounter (Signed)
s/w both pt and her husband about staying on ASA until 08/08/13 will be her last dose of ASA, will continue to stay on coumadin and plavix. Both verbalized understanding to Plan of Care. Pt is taking benazepril per husband.

## 2013-08-07 DIAGNOSIS — I5022 Chronic systolic (congestive) heart failure: Secondary | ICD-10-CM

## 2013-08-07 DIAGNOSIS — E785 Hyperlipidemia, unspecified: Secondary | ICD-10-CM

## 2013-08-07 DIAGNOSIS — I2589 Other forms of chronic ischemic heart disease: Secondary | ICD-10-CM

## 2013-08-07 DIAGNOSIS — I2109 ST elevation (STEMI) myocardial infarction involving other coronary artery of anterior wall: Secondary | ICD-10-CM

## 2013-08-07 DIAGNOSIS — I10 Essential (primary) hypertension: Secondary | ICD-10-CM

## 2013-08-10 ENCOUNTER — Other Ambulatory Visit (INDEPENDENT_AMBULATORY_CARE_PROVIDER_SITE_OTHER): Payer: Medicare Other

## 2013-08-10 DIAGNOSIS — I2510 Atherosclerotic heart disease of native coronary artery without angina pectoris: Secondary | ICD-10-CM

## 2013-08-10 DIAGNOSIS — I2589 Other forms of chronic ischemic heart disease: Secondary | ICD-10-CM

## 2013-08-10 DIAGNOSIS — I255 Ischemic cardiomyopathy: Secondary | ICD-10-CM

## 2013-08-10 DIAGNOSIS — I513 Intracardiac thrombosis, not elsewhere classified: Secondary | ICD-10-CM

## 2013-08-10 DIAGNOSIS — I2109 ST elevation (STEMI) myocardial infarction involving other coronary artery of anterior wall: Secondary | ICD-10-CM

## 2013-08-10 LAB — LIPID PANEL
Cholesterol: 90 mg/dL (ref 0–200)
HDL: 34.8 mg/dL — AB (ref >39.00)
LDL Cholesterol: 40 mg/dL (ref 0–99)
TRIGLYCERIDES: 76 mg/dL (ref 0.0–149.0)
Total CHOL/HDL Ratio: 3
VLDL: 15.2 mg/dL (ref 0.0–40.0)

## 2013-08-10 LAB — HEPATIC FUNCTION PANEL
ALBUMIN: 3.3 g/dL — AB (ref 3.5–5.2)
ALT: 12 U/L (ref 0–35)
AST: 17 U/L (ref 0–37)
Alkaline Phosphatase: 43 U/L (ref 39–117)
Bilirubin, Direct: 0 mg/dL (ref 0.0–0.3)
TOTAL PROTEIN: 6.1 g/dL (ref 6.0–8.3)
Total Bilirubin: 0.7 mg/dL (ref 0.3–1.2)

## 2013-08-22 ENCOUNTER — Ambulatory Visit (INDEPENDENT_AMBULATORY_CARE_PROVIDER_SITE_OTHER): Payer: Medicare Other | Admitting: Cardiology

## 2013-08-22 ENCOUNTER — Encounter: Payer: Self-pay | Admitting: Cardiology

## 2013-08-22 VITALS — BP 124/42 | HR 77 | Ht 63.5 in | Wt 151.0 lb

## 2013-08-22 DIAGNOSIS — I255 Ischemic cardiomyopathy: Secondary | ICD-10-CM

## 2013-08-22 DIAGNOSIS — Z951 Presence of aortocoronary bypass graft: Secondary | ICD-10-CM

## 2013-08-22 DIAGNOSIS — I513 Intracardiac thrombosis, not elsewhere classified: Secondary | ICD-10-CM

## 2013-08-22 DIAGNOSIS — I214 Non-ST elevation (NSTEMI) myocardial infarction: Secondary | ICD-10-CM

## 2013-08-22 DIAGNOSIS — I2109 ST elevation (STEMI) myocardial infarction involving other coronary artery of anterior wall: Secondary | ICD-10-CM

## 2013-08-22 DIAGNOSIS — I2589 Other forms of chronic ischemic heart disease: Secondary | ICD-10-CM

## 2013-08-22 DIAGNOSIS — I2510 Atherosclerotic heart disease of native coronary artery without angina pectoris: Secondary | ICD-10-CM

## 2013-08-22 MED ORDER — POTASSIUM CHLORIDE ER 10 MEQ PO TBCR: 10.0000 meq | EXTENDED_RELEASE_TABLET | Freq: Every day | ORAL | Status: DC

## 2013-08-22 MED ORDER — FUROSEMIDE 20 MG PO TABS: 20.0000 mg | ORAL_TABLET | Freq: Every day | ORAL | Status: AC

## 2013-08-22 NOTE — Progress Notes (Signed)
[address] [place], [place]  [address] Phone: [phone] Fax:  [phone]  Date:  08/22/2013   ID:  Jasmin Vargas, DOB December 23, 1923, MRN [id number]  PCP:  Lilian Coma, MD  Cardiologist:  Dr. Badr Piedra [place]    History of Present Illness: Jasmin Vargas is a 80 y.o. female with a hx of CAD, status post remote CABG in 1993 at [facility] in [place], HTN, HL, breast CA.   She was admitted 12/18-12/21 with a non-STEMI. LHC (07/07/2013):  oLAD occluded, pCFX 30%, pRCA 80%, mid RCA occluded, SVG-PDA patent, SVG-LAD patent with mid 90% stenosis, EF 30-35% with anterior and apical HK and apical filling defect consistent with thrombus.  PCI: Promus (4 x 16 mm) DES to the mid SVG-LAD.  Echocardiogram (07/07/2013): Severe HK of the anterior septum, anterior wall and inferior septum, mild HK of the inferior wall, inferolateral wall and anterolateral wall, possible small area of clot at the apical, EF 30%, mild AI, mild MR, mild LAE.  Patient noted a hx of rectal bleeding as well as epistaxis. There was some concern (in regards to her bleeding risk) about whether the patient should be on Coumadin (for LV apical thrombus) in addition to Plavix and ASA. She was ultimately placed on all 3.  Now off ASA. Coumadin target 2-2.5 INR.  She has overall been doing well since discharge from the hospital. She did have a scab on her shin that she picked off and has since had clear drainage from the site. Legs are more swollen. No bleeding. No chest pain or dyspnea. Husband notes more problems with memory and weaker since in the hospital. States she just wants to sit in chair.  Recent Labs: 07/09/2013: Hemoglobin 10.4*  07/18/2013: Creatinine 0.7; Potassium 4.4  08/10/2013: ALT 12; HDL Cholesterol 34.80*; LDL (calc) 40   Wt Readings from Last 3 Encounters:  08/22/13 151 lb (68.493 kg)  07/18/13 148 lb (67.132 kg)  07/08/13 151 lb 10.8 oz (68.8 kg)     Past Medical History   Diagnosis Date  . Coronary artery disease     a. MI in 1993, prior history of CABG. b. 06/2013: NSTEMI, s/p  DES to SVG to the LAD.  Marland Kitchen Hypertension   . Artificial eye fitting/adjustment   . Myocardial infarction   . UTI (urinary tract infection)   . Breast cancer     1992 no chemo/radiation  . Ischemic cardiomyopathy     a. EF 30-35% by cath and echo this adm.  Marland Kitchen Apical mural thrombus     a. 06/2013. Placed on Coumadin cautiously given hx of epistaxis and rectal bleeding.  . Glaucoma   . Urinary incontinence     Current Outpatient Prescriptions  Medication Sig Dispense Refill  . atorvastatin (LIPITOR) 40 MG tablet Take 1 tablet (40 mg total) by mouth every evening.  30 tablet  6  . benazepril (LOTENSIN) 5 MG tablet Take 5 mg by mouth daily.      . cholecalciferol (VITAMIN D) 1000 UNITS tablet Take 2,000 Units by mouth at bedtime.      . clopidogrel (PLAVIX) 75 MG tablet Take 1 tablet (75 mg total) by mouth daily with breakfast.  30 tablet  6  . Coenzyme Q10 (COQ-10) 200 MG CAPS Take 1 capsule by mouth daily.      . Cranberry 450 MG TABS Take 900 mg by mouth at bedtime.      . diphenhydrAMINE (BENADRYL) 12.5 MG chewable tablet  Chew 12.5 mg by mouth at bedtime as needed for sleep.      Marland Kitchen latanoprost (XALATAN) 0.005 % ophthalmic solution Place 1 drop into the left eye at bedtime.      . Magnesium 100 MG TABS Take 600 mg by mouth at bedtime.      . metoprolol succinate (TOPROL XL) 25 MG 24 hr tablet Take 1 tablet (25 mg total) by mouth daily.  30 tablet  6  . neomycin-polymyxin b-dexamethasone (MAXITROL) 3.5-10000-0.1 OINT Place 1 application into the right eye at bedtime.      . nitroGLYCERIN (NITROSTAT) 0.4 MG SL tablet Place 1 tablet (0.4 mg total) under the tongue every 5 (five) minutes as needed for chest pain (up to 3 doses).  25 tablet  3  . vitamin C (ASCORBIC ACID) 500 MG tablet Take 1,000 mg by mouth at bedtime.      Marland Kitchen warfarin (COUMADIN) 5 MG tablet Take 1 tablet (5 mg total)  by mouth daily. Or as directed.  30 tablet  1  . furosemide (LASIX) 20 MG tablet Take 1 tablet (20 mg total) by mouth daily.  90 tablet  3  . potassium chloride (K-DUR) 10 MEQ tablet Take 1 tablet (10 mEq total) by mouth daily.  90 tablet  3   No current facility-administered medications for this visit.    Allergies:   Codeine and Sulfa antibiotics   Social History:  The patient  reports that she has never smoked. She has never used smokeless tobacco. She reports that she does not drink alcohol or use illicit drugs.   Family History:  The patient's family history is not on file.   ROS:  Please see the history of present illness.   She denies melena, hematochezia, hematuria.   All other systems reviewed and negative.   PHYSICAL EXAM: VS:  BP 124/42  Pulse 77  Ht 5' 3.5" (1.613 m)  Wt 151 lb (68.493 kg)  BMI 26.33 kg/m2 Well nourished, well developed, in no acute distress HEENT: normal Neck: no JVD at 90 Cardiac:  normal S1, S2; RRR; no murmur Lungs:  clear to auscultation bilaterally, no wheezing, rhonchi or rales Abd: soft, nontender, no hepatomegaly Ext: 1-2+ edema; serous drainage left shin. Skin: warm and dry Neuro:  CNs 2-12 intact, no focal abnormalities noted  Lab Results  Component Value Date   WBC 8.0 07/09/2013   HGB 10.4* 07/09/2013   HCT 32.6* 07/09/2013   PLT 303 07/09/2013   GLUCOSE 94 07/18/2013   CHOL 90 08/10/2013   TRIG 76.0 08/10/2013   HDL 34.80* 08/10/2013   LDLCALC 40 08/10/2013   ALT 12 08/10/2013   AST 17 08/10/2013   NA 134* 07/18/2013   K 4.4 07/18/2013   CL 100 07/18/2013   CREATININE 0.7 07/18/2013   BUN 17 07/18/2013   CO2 28 07/18/2013   INR 3.5 07/18/2013   Ecg: NSR T wave abnormality consistent with lateral ischemia. Compared to 07/18/13 anterior T wave inversion resolved.  ASSESSMENT AND PLAN:  1. CAD:  She is doing well after non-STEMI treated with a DES to the SVG-LAD.  No angina.  Continue  Plavix and coumadin. Target INR  2-2.5 2. LV Apical Thrombus: Continue Coumadin. She denies any evidence of bleeding. 3. Ischemic Cardiomyopathy: Continue beta blocker, ACEi. Increased edema noted. Will add lasix 20 mg daily with potassium 10 meq daily. Follow up BMET in 3 weeks. 4. Chronic Systolic CHF 5. Hyperlipidemia: Continue statin. Recent lipids OK. Husband reports she  had problems taking lipitor following CABG. Will continue for now. If side effect recur may need to alter therapy. 6. Hypertension: controlled. 7. Disposition: Patient is moving to [facility] this week. Will refer to cardiology there for close follow up.

## 2013-08-22 NOTE — Patient Instructions (Addendum)
Start Lasix 20 mg daily  Start potassium 10 meq daily.  Keep your feet elevated as much as possible when at rest.  I recommend you follow up with cardiology in 3-4 weeks.   Good luck with your move!

## 2013-08-24 ENCOUNTER — Telehealth: Payer: Self-pay

## 2013-08-24 NOTE — Telephone Encounter (Signed)
Appointment scheduled with Dr.Fazia in [facility] 09/13/13 at 1:00 pm.Patient's office notes,echo,recent discharge summary faxed to Dr.Fazia at fax # [phone].

## 2014-06-28 ENCOUNTER — Encounter (HOSPITAL_COMMUNITY): Payer: Self-pay | Admitting: Cardiology

## 2014-08-21 ENCOUNTER — Other Ambulatory Visit: Payer: Self-pay | Admitting: *Deleted

## 2014-08-21 DIAGNOSIS — I255 Ischemic cardiomyopathy: Secondary | ICD-10-CM

## 2014-08-21 MED ORDER — POTASSIUM CHLORIDE ER 10 MEQ PO TBCR: 10.0000 meq | EXTENDED_RELEASE_TABLET | Freq: Every day | ORAL | Status: AC

## 2014-09-18 DEATH — deceased

## 2015-05-05 IMAGING — CR DG CHEST 1V PORT
1 series · 1 of 1 positions shown · non-contrast
Comparison: None.

CLINICAL DATA: Chest pain.

EXAM:
PORTABLE CHEST - 1 VIEW

[AP]
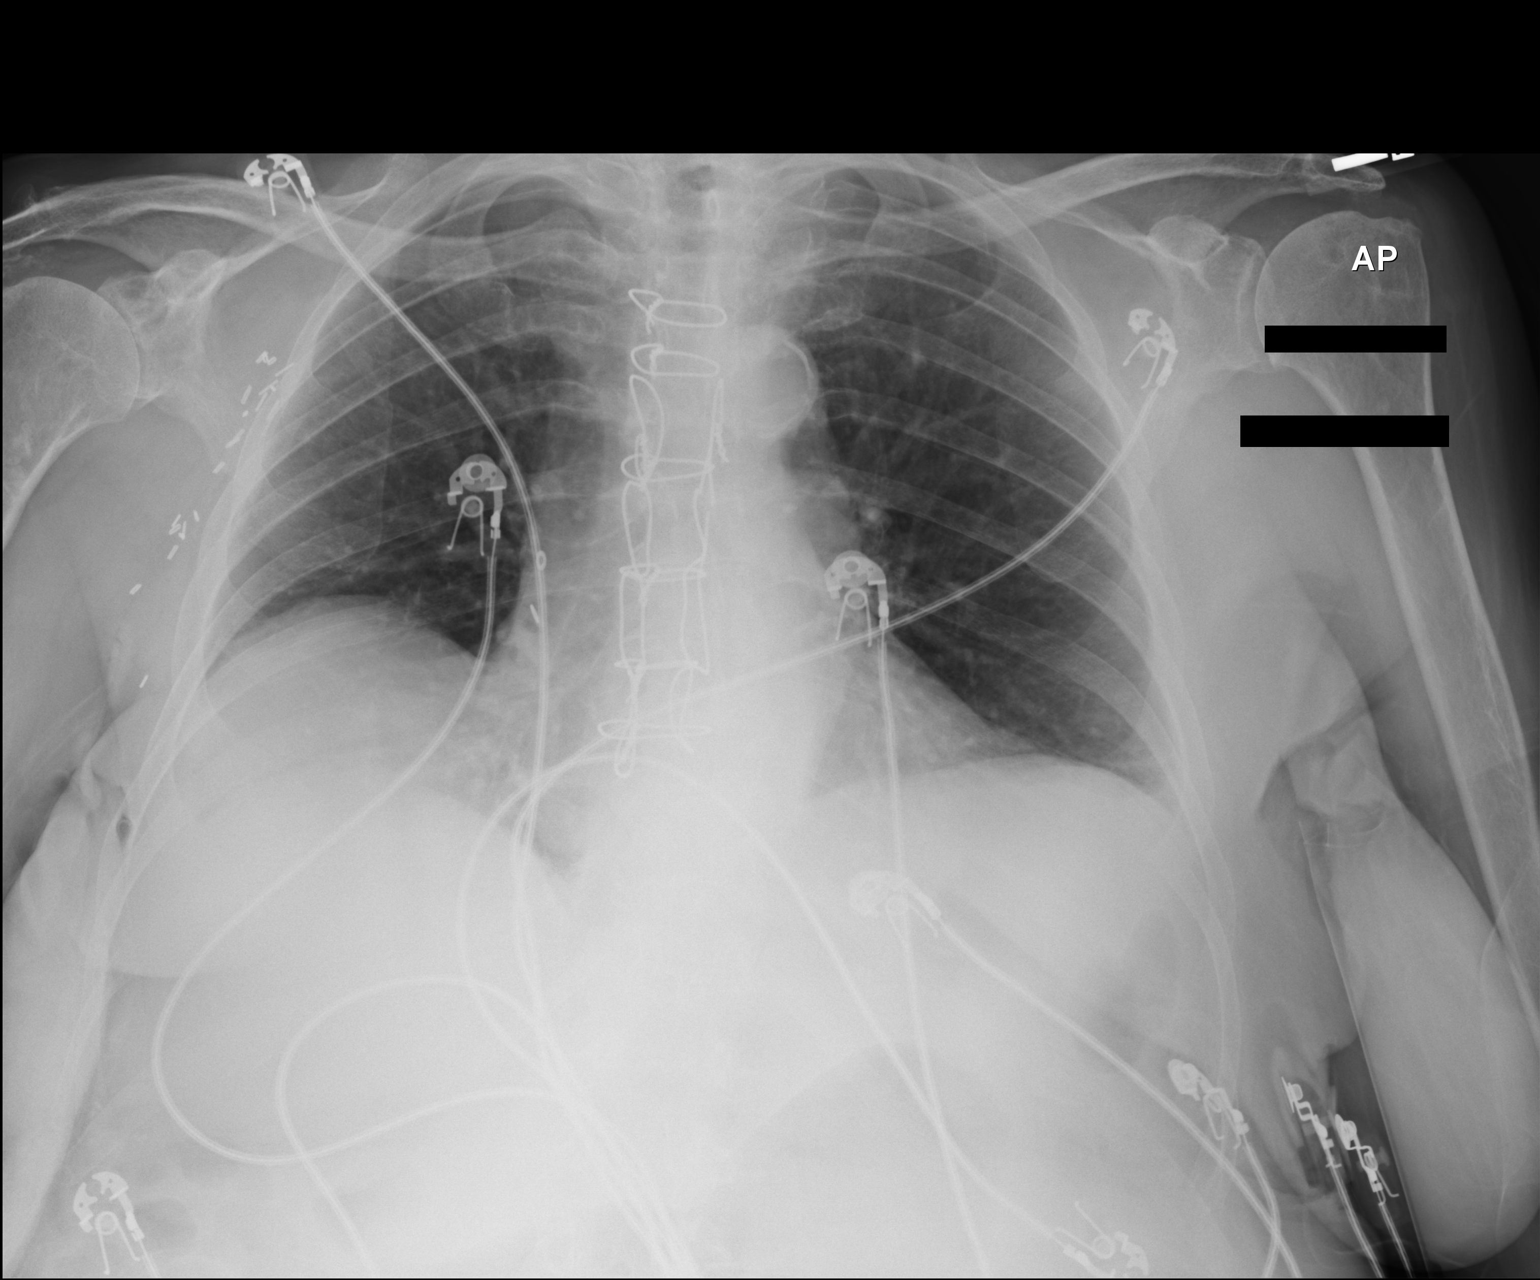

[1 of 1 positions shown; findings below may reference images not displayed]

FINDINGS: Portable view of the chest demonstrates elevation of the right
hemidiaphragm. Postsurgical changes in the sternum and right axilla.
Lungs are clear without airspace disease or edema. Heart size is
within normal limits. Negative for a pneumothorax.
IMPRESSION: Postoperative changes in the chest. No acute cardiopulmonary
disease.
